# Patient Record
Sex: Male | Born: 1995 | Race: Black or African American | Hispanic: No | Marital: Single | Smoking: Current some day smoker
Health system: Southern US, Community
[De-identification: ages and names within clinical notes are randomized; demographics above are authoritative.]

## PROBLEM LIST (undated history)

## (undated) DIAGNOSIS — L309 Dermatitis, unspecified: Secondary | ICD-10-CM

---
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
## 2021-12-28 ENCOUNTER — Emergency Department (HOSPITAL_COMMUNITY): Payer: Self-pay

## 2021-12-28 ENCOUNTER — Emergency Department (HOSPITAL_COMMUNITY)
Admission: EM | Admit: 2021-12-28 | Discharge: 2021-12-28 | Disposition: A | Discharge status: Home / Self Care | Payer: Self-pay | Attending: Emergency Medicine | Admitting: Emergency Medicine

## 2021-12-28 ENCOUNTER — Encounter (HOSPITAL_COMMUNITY): Payer: Self-pay | Admitting: *Deleted

## 2021-12-28 ENCOUNTER — Other Ambulatory Visit: Payer: Self-pay

## 2021-12-28 DIAGNOSIS — M25461 Effusion, right knee: Secondary | ICD-10-CM | POA: Insufficient documentation

## 2021-12-28 HISTORY — DX: Dermatitis, unspecified: L30.9

## 2021-12-28 LAB — CBC WITH DIFFERENTIAL/PLATELET
Abs Immature Granulocytes: 0.02 10*3/uL (ref 0.00–0.07)
Basophils Absolute: 0 10*3/uL (ref 0.0–0.1)
Basophils Relative: 0 %
Eosinophils Absolute: 0.1 10*3/uL (ref 0.0–0.5)
Eosinophils Relative: 1 %
HCT: 47.2 % (ref 39.0–52.0)
Hemoglobin: 15.7 g/dL (ref 13.0–17.0)
Immature Granulocytes: 0 %
Lymphocytes Relative: 28 %
Lymphs Abs: 2.2 10*3/uL (ref 0.7–4.0)
MCH: 28.1 pg (ref 26.0–34.0)
MCHC: 33.3 g/dL (ref 30.0–36.0)
MCV: 84.6 fL (ref 80.0–100.0)
Monocytes Absolute: 0.6 10*3/uL (ref 0.1–1.0)
Monocytes Relative: 7 %
Neutro Abs: 4.8 10*3/uL (ref 1.7–7.7)
Neutrophils Relative %: 64 %
Platelets: 271 10*3/uL (ref 150–400)
RBC: 5.58 MIL/uL (ref 4.22–5.81)
RDW: 12.1 % (ref 11.5–15.5)
WBC: 7.7 10*3/uL (ref 4.0–10.5)
nRBC: 0 % (ref 0.0–0.2)

## 2021-12-28 LAB — COMPREHENSIVE METABOLIC PANEL
ALT: 10 U/L (ref 0–44)
AST: 13 U/L — ABNORMAL LOW (ref 15–41)
Albumin: 3.8 g/dL (ref 3.5–5.0)
Alkaline Phosphatase: 85 U/L (ref 38–126)
Anion gap: 10 (ref 5–15)
BUN: 10 mg/dL (ref 6–20)
CO2: 26 mmol/L (ref 22–32)
Calcium: 9.4 mg/dL (ref 8.9–10.3)
Chloride: 99 mmol/L (ref 98–111)
Creatinine, Ser: 0.92 mg/dL (ref 0.61–1.24)
GFR, Estimated: >60 mL/min (ref >60)
Glucose, Bld: 90 mg/dL (ref 70–99)
Potassium: 4.4 mmol/L (ref 3.5–5.1)
Sodium: 135 mmol/L (ref 135–145)
Total Bilirubin: 0.7 mg/dL (ref 0.3–1.2)
Total Protein: 7.4 g/dL (ref 6.5–8.1)

## 2021-12-28 LAB — SYNOVIAL CELL COUNT + DIFF, W/ CRYSTALS
Eosinophils-Synovial: 0 % (ref 0–1)
Lymphocytes-Synovial Fld: 37 % — ABNORMAL HIGH (ref 0–20)
Monocyte-Macrophage-Synovial Fluid: 15 % — ABNORMAL LOW (ref 50–90)
Neutrophil, Synovial: 48 % — ABNORMAL HIGH (ref 0–25)
WBC, Synovial: 8295 /mm3 — ABNORMAL HIGH (ref 0–200)

## 2021-12-28 LAB — GRAM STAIN

## 2021-12-28 LAB — C-REACTIVE PROTEIN: CRP: 3.9 mg/dL — ABNORMAL HIGH (ref <1.0)

## 2021-12-28 LAB — SEDIMENTATION RATE: Sed Rate: 13 mm/hr (ref 0–16)

## 2021-12-28 MED ORDER — LIDOCAINE-EPINEPHRINE 2 %-1:100000 IJ SOLN: 20.0000 mL | Freq: Once | INTRAMUSCULAR | Status: DC

## 2021-12-28 MED ORDER — NAPROXEN 375 MG PO TABS: 375.0000 mg | ORAL_TABLET | Freq: Two times a day (BID) | ORAL | 0 refills | Status: AC

## 2021-12-28 MED ORDER — ACETAMINOPHEN 325 MG PO TABS
650.0000 mg | ORAL_TABLET | Freq: Once | ORAL | Status: AC
  Administered 2021-12-28: 650 mg via ORAL
  Filled 2021-12-28: qty 2

## 2021-12-28 MED ORDER — LIDOCAINE-EPINEPHRINE (PF) 2 %-1:200000 IJ SOLN
20.0000 mL | Freq: Once | INTRAMUSCULAR | Status: AC
  Administered 2021-12-28: 20 mL via INTRADERMAL
  Filled 2021-12-28: qty 20

## 2021-12-28 NOTE — ED Provider Notes (Cosign Needed)
[facility] EMERGENCY DEPARTMENT Provider Note   CSN: [id number] Arrival date & time: 12/28/21  1419     History  Chief Complaint  Patient presents with   Knee Pain    Johnny Payne is a 26 y.o. male.  26 y.o male with no PMH presents to the ED with a chief complaint of right knee pain (non-traumatic) x 3 days. Patient reports watching TV when he noted, pain localized to the right knee, there was some swelling noted at first which later worsened the swelling.  He reports worsening pain as the days have gone by.  He has been taking ibuprofen, Advil without any improvement in symptoms.  He did have an accident 2 months ago, where he had a garage door fall on his abdomen, right leg, but reports he did not have any issues after this with his right leg.  On today's visit, he reports worsening swelling, worsening pain.  He is sexually active, with females last encounter approximately a month ago.  He is without any penile discharge, no fever, no other complaints.   The history is provided by the patient and medical records.  Knee Pain Location:  Knee Time since incident:  3 days Injury: no   Knee location:  R knee Pain details:    Quality:  Aching   Radiates to:  Does not radiate   Severity:  Moderate   Onset quality:  Sudden   Duration:  3 days   Timing:  Constant   Progression:  Worsening Chronicity:  New Dislocation: no   Relieved by:  Nothing Worsened by:  Flexion Ineffective treatments:  Acetaminophen Associated symptoms: fever   Risk factors: no known bone disorder, no obesity and no recent illness       Home Medications Prior to Admission medications   Medication Sig Start Date End Date Taking? Authorizing Provider  naproxen (NAPROSYN) 375 MG tablet Take 1 tablet (375 mg total) by mouth 2 (two) times daily for 7 days. 12/28/21 01/04/22 Yes Esterlene Atiyeh, Beverley Fiedler, PA-C      Allergies    Vancomycin    Review of Systems   Review of Systems  Constitutional:   Positive for fever.  Musculoskeletal:  Positive for arthralgias.   Physical Exam Updated Vital Signs BP 119/60    Pulse 78    Temp 100.1 F (37.8 C) (Oral)    Resp 16    Ht 5\' 11"  (1.803 m)    Wt 68 kg    SpO2 98%    BMI 20.92 kg/m  Physical Exam Vitals and nursing note reviewed.  Constitutional:      Appearance: Normal appearance.  HENT:     Head: Normocephalic and atraumatic.     Mouth/Throat:     Mouth: Mucous membranes are moist.     Comments: No exudates, tonsils equal and symmetric.  Eyes:     Pupils: Pupils are equal, round, and reactive to light.  Cardiovascular:     Rate and Rhythm: Normal rate.  Pulmonary:     Effort: Pulmonary effort is normal.  Abdominal:     General: Abdomen is flat.  Musculoskeletal:        General: Swelling present.     Cervical back: Normal range of motion and neck supple.     Right lower leg: Swelling and tenderness present. No bony tenderness.       Legs:  Skin:    General: Skin is warm and dry.  Neurological:     Mental Status:  He is alert and oriented to person, place, and time.    ED Results / Procedures / Treatments   Labs (all labs ordered are listed, but only abnormal results are displayed) Labs Reviewed  COMPREHENSIVE METABOLIC PANEL - Abnormal; Notable for the following components:      Result Value   AST 13 (*)    All other components within normal limits  C-REACTIVE PROTEIN - Abnormal; Notable for the following components:   CRP 3.9 (*)    All other components within normal limits  GRAM STAIN  CULTURE, BODY FLUID W GRAM STAIN -BOTTLE  CBC WITH DIFFERENTIAL/PLATELET  SEDIMENTATION RATE  GLUCOSE, BODY FLUID OTHER            PROTEIN, BODY FLUID (OTHER)  SYNOVIAL CELL COUNT + DIFF, W/ CRYSTALS    EKG None  Radiology DG Knee Complete 4 Views Right  Result Date: 12/28/2021 CLINICAL DATA:  Right knee swelling and pain. EXAM: RIGHT KNEE - COMPLETE 4+ VIEW COMPARISON:  None. FINDINGS: No acute fracture or dislocation.  Moderate to large suprapatellar joint effusion. Joint spaces maintained. IMPRESSION: Moderate suprapatellar joint effusion. Electronically Signed   By: Abigail Miyamoto M.D.   On: 12/28/2021 16:17    Procedures .Joint Aspiration/Arthrocentesis  Date/Time: 12/28/2021 6:23 PM Performed by: Janeece Fitting, PA-C Authorized by: Janeece Fitting, PA-C   Consent:    Consent obtained:  Verbal   Consent given by:  Patient   Risks discussed:  Infection, pain, bleeding and incomplete drainage   Alternatives discussed:  No treatment Universal protocol:    Procedure explained and questions answered to patient or proxy's satisfaction: yes     Relevant documents present and verified: yes     Test results available: yes     Site/side marked: yes     Patient identity confirmed:  Verbally with patient Location:    Location:  Knee   Knee:  R knee Anesthesia:    Anesthesia method:  Local infiltration   Local anesthetic:  Lidocaine 1% WITH epi Procedure details:    Needle gauge:  18 G   Approach:  Lateral   Aspirate amount:  75   Aspirate characteristics:  Clear and yellow   Steroid injected: no     Specimen collected: yes   Post-procedure details:    Dressing:  Adhesive bandage   Procedure completion:  Tolerated well, no immediate complications    Medications Ordered in ED Medications  acetaminophen (TYLENOL) tablet 650 mg (650 mg Oral Given 12/28/21 1702)  lidocaine-EPINEPHrine (XYLOCAINE W/EPI) 2 %-1:200000 (PF) injection 20 mL (20 mLs Intradermal Given 12/28/21 1819)    ED Course/ Medical Decision Making/ A&P Clinical Course as of 12/28/21 2025  Sat Dec 28, 2021  1959 Gram Stain: FEW WBC PRESENT,BOTH PMN AND MONONUCLEAR NO ORGANISMS SEEN Performed at [facility], [address] [place], [place] [address]  [JS]    Clinical Course User Index [JS] Janeece Fitting, PA-C                            This patient presents to the ED for concern of right knee pain, this involves a number of  treatment options, and is a complaint that carries with it a high risk of complications and morbidity.  The differential diagnosis includes septic knee, gonococcal arthritis, Lyme disease, trauma.    Co morbidities: Discussed in HPI   Brief History:  Patient here with swollen right atraumatic knee for 3 days.  Febrile on arrival with a temperature of 100.1, has taken over-the-counter medications for improvement in symptoms.  No other signs or symptoms.  EMR reviewed including pt PMHx, past surgical history and past visits to ER.   See HPI for more details   Lab Tests:  I ordered and independently interpreted labs.  The pertinent results include:    I personally reviewed all laboratory work and imaging. Metabolic panel without any acute abnormality specifically kidney function within normal limits and no significant electrolyte abnormalities. CBC without leukocytosis or significant anemia.  CRP level is abnormal today at 3.9, some suspicion for infection at this time.   Imaging Studies:  Moderate to large suprapatellar joint effusion.  Medicines ordered:  I ordered medication including Tylenol for pyrexia Reevaluation of the patient after these medicines showed that the patient improved I have reviewed the patients home medicines and have made adjustments as needed   Critical Interventions:  Knee aspiration performed by me supervised by my attending Dr. Jeanell Sparrow. Approximately 75 ml of clear yellow fluid drained from right knee, specimen walked to lab. Patient tolerated this well.   Reevaluation:  After the interventions noted above I re-evaluated patient and found that they have :improved   Social Determinants of Health:  The patient's social determinants of health were a factor in the care of this patient    Problem List / ED Course:  Patient here with atraumatic knee. Swelling noted for the past 3 days, no improvement with anti-inflammatories Labs without any  leukocytosis, however was febrile on arrival.  Gram stain obtain which did not show any acute infection at this time to suggest septic knee.  He is ambulating on knee with steady gate. We discussed RICE therapy along with follow up with orthopedist.    Dispostion:  After consideration of the diagnostic results and the patients response to treatment, I feel that the patent would benefit from outpatient follow-up with orthopedics, RICE therapy, anti-inflammatories.    Portions of this note were generated with [employer]. Dictation errors may occur despite best attempts at proofreading.  Final Clinical Impression(s) / ED Diagnoses Final diagnoses:  Effusion of right knee    Rx / DC Orders ED Discharge Orders          Ordered    naproxen (NAPROSYN) 375 MG tablet  2 times daily        12/28/21 2000              Janeece Fitting, PA-C 12/28/21 2025

## 2021-12-28 NOTE — ED Triage Notes (Signed)
PT states swelling to R knee. States some pain.  Denies injury. ?

## 2021-12-28 NOTE — Discharge Instructions (Addendum)
Your labs today did not show any acute infection. ? ?We sent off the cultures for your right knee.  You will need to schedule an appointment with orthopedics in order to further follow-up for this swelling of your right knee. ? ?I have prescribed a short course of anti-inflammatories in order to help with your pain.  Please take 1 tablet twice a day for the next 7 days. ?

## 2021-12-28 NOTE — ED Notes (Signed)
Pt d/c home per MD order., Discharge summary reviewed with pt, pt verbalizes understanding. Off unit via WC. No s./s of acute distress noted. Discharged home with visitor.  ?

## 2021-12-30 LAB — GLUCOSE, BODY FLUID OTHER: Glucose, Body Fluid Other: 42 mg/dL

## 2021-12-30 LAB — PATHOLOGIST SMEAR REVIEW

## 2021-12-30 LAB — PROTEIN, BODY FLUID (OTHER): Total Protein, Body Fluid Other: 5.1 g/dL

## 2022-01-01 ENCOUNTER — Ambulatory Visit (INDEPENDENT_AMBULATORY_CARE_PROVIDER_SITE_OTHER): Payer: Self-pay | Admitting: Orthopaedic Surgery

## 2022-01-01 ENCOUNTER — Other Ambulatory Visit: Payer: Self-pay

## 2022-01-01 DIAGNOSIS — M1A0610 Idiopathic chronic gout, right knee, without tophus (tophi): Secondary | ICD-10-CM

## 2022-01-01 MED ORDER — TRIAMCINOLONE ACETONIDE 40 MG/ML IJ SUSP
80.0000 mg | INTRAMUSCULAR | Status: AC | PRN
  Administered 2022-01-01: 80 mg via INTRA_ARTICULAR

## 2022-01-01 MED ORDER — LIDOCAINE HCL 1 % IJ SOLN
4.0000 mL | INTRAMUSCULAR | Status: AC | PRN
  Administered 2022-01-01: 4 mL

## 2022-01-01 NOTE — Progress Notes (Signed)
? ?                            ? ? ?Chief Complaint: Right knee pain ?  ? ? ?History of Present Illness:  ? ? ?Johnny Payne is a 26 y.o. male presents with 1 week of atraumatic right knee pain.  He initially presented to the emergency room at which time he had his knee aspirated.  There was monosodium urate crystals.  He was sent to orthopedics for further management.  Denies any history of known gout.  He is very active.  Enjoys playing video games and is very time.  Denies any fevers or chills. ? ? ? ?Surgical History:   ?None ? ?PMH/PSH/Family History/Social History/Meds/Allergies:   ? ?Past Medical History:  ?Diagnosis Date  ?? Eczema   ? ?No past surgical history on file. ?Social History  ? ?Socioeconomic History  ?? Marital status: Single  ?  Spouse name: Not on file  ?? Number of children: Not on file  ?? Years of education: Not on file  ?? Highest education level: Not on file  ?Occupational History  ?? Not on file  ?Tobacco Use  ?? Smoking status: Never  ?? Smokeless tobacco: Not on file  ?Substance and Sexual Activity  ?? Alcohol use: No  ?? Drug use: No  ?? Sexual activity: Never  ?Other Topics Concern  ?? Not on file  ?Social History Narrative  ?? Not on file  ? ?Social Determinants of Health  ? ?Financial Resource Strain: Not on file  ?Food Insecurity: Not on file  ?Transportation Needs: Not on file  ?Physical Activity: Not on file  ?Stress: Not on file  ?Social Connections: Not on file  ? ?Family History  ?Problem Relation Age of Onset  ?? Asthma Maternal Uncle   ?? Cancer Maternal Grandfather   ? ?Allergies  ?Allergen Reactions  ?? Vancomycin Swelling  ?  Face, mouth and throat  ? ?Current Outpatient Medications  ?Medication Sig Dispense Refill  ?? naproxen (NAPROSYN) 375 MG tablet Take 1 tablet (375 mg total) by mouth 2 (two) times daily for 7 days. 14 tablet 0  ? ?No current facility-administered medications for this visit.  ? ?No results found. ? ?Review of Systems:   ?A ROS was performed  including pertinent positives and negatives as documented in the HPI. ? ?Physical Exam :   ?Constitutional: NAD and appears stated age ?Neurological: Alert and oriented ?Psych: Appropriate affect and cooperative ?There were no vitals taken for this visit.  ? ?Comprehensive Musculoskeletal Exam:   ? ?He has tenderness about all aspects of the knee.  There is an effusion.  He has range of motion from 0 to 120 degrees with minimal pain.  He has no erythema.  There is no redness.  Sensation is intact throughout.  2+ dorsalis pedis pulse ? ?Imaging:   ?Xray (4 views right knee): ?Normal ? ? ?I personally reviewed and interpreted the radiographs. ? ? ?Assessment:   ?26 year old male with evidence of right knee gout.  Today's visit I have recommended an ultrasound-guided injection of the right knee in order to give him better pain relief.  He would like to proceed with this.  I will see him back on an as-needed basis. ? ?Plan :   ? ?-Rheumatology referral ordered for long-term gout management ? ? ? ? ?Procedure Note ? ?Patient: Johnny Payne             ?  Date of Birth: April 19, 1996           ?MRN: [id number]             ?Visit Date: 01/01/2022 ? ?Procedures: ?Visit Diagnoses:  ?1. Idiopathic chronic gout of right knee without tophus   ? ? ?Large Joint Inj on 01/01/2022 12:07 PM ?Indications: pain ?Details: 22 G 1.5 in needle, ultrasound-guided anterior approach ? ?Arthrogram: No ? ?Medications: 4 mL lidocaine 1 %; 80 mg triamcinolone acetonide 40 MG/ML ?Outcome: tolerated well, no immediate complications ?Procedure, treatment alternatives, risks and benefits explained, specific risks discussed. Consent was given by the patient. Immediately prior to procedure a time out was called to verify the correct patient, procedure, equipment, support staff and site/side marked as required. Patient was prepped and draped in the usual sterile fashion.  ? ? ? ? ? ?I personally saw and evaluated the patient, and participated in the management  and treatment plan. ? ?Huel Cote, MD ?Attending Physician, Orthopedic Surgery ? ?This document was dictated using Conservation officer, historic buildings. A reasonable attempt at proof reading has been made to minimize errors. ?

## 2022-01-02 LAB — CULTURE, BODY FLUID W GRAM STAIN -BOTTLE: Culture: NO GROWTH

## 2022-09-20 IMAGING — DX DG KNEE COMPLETE 4+V*R*
4 series · 4 of 4 positions shown · non-contrast
Comparison: None.

CLINICAL DATA: Right knee swelling and pain.

EXAM:
RIGHT KNEE - COMPLETE 4+ VIEW

[knee ap]
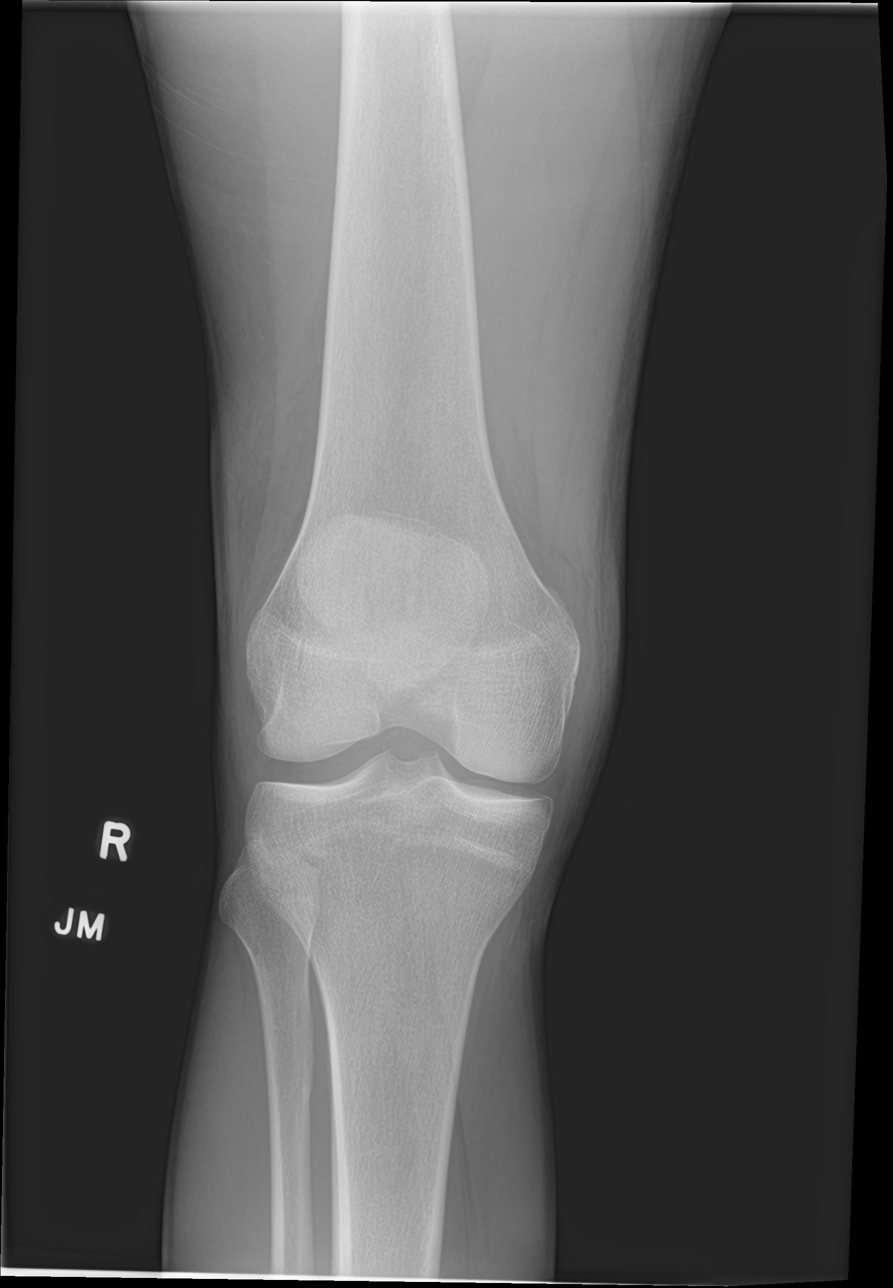

[knee lat]
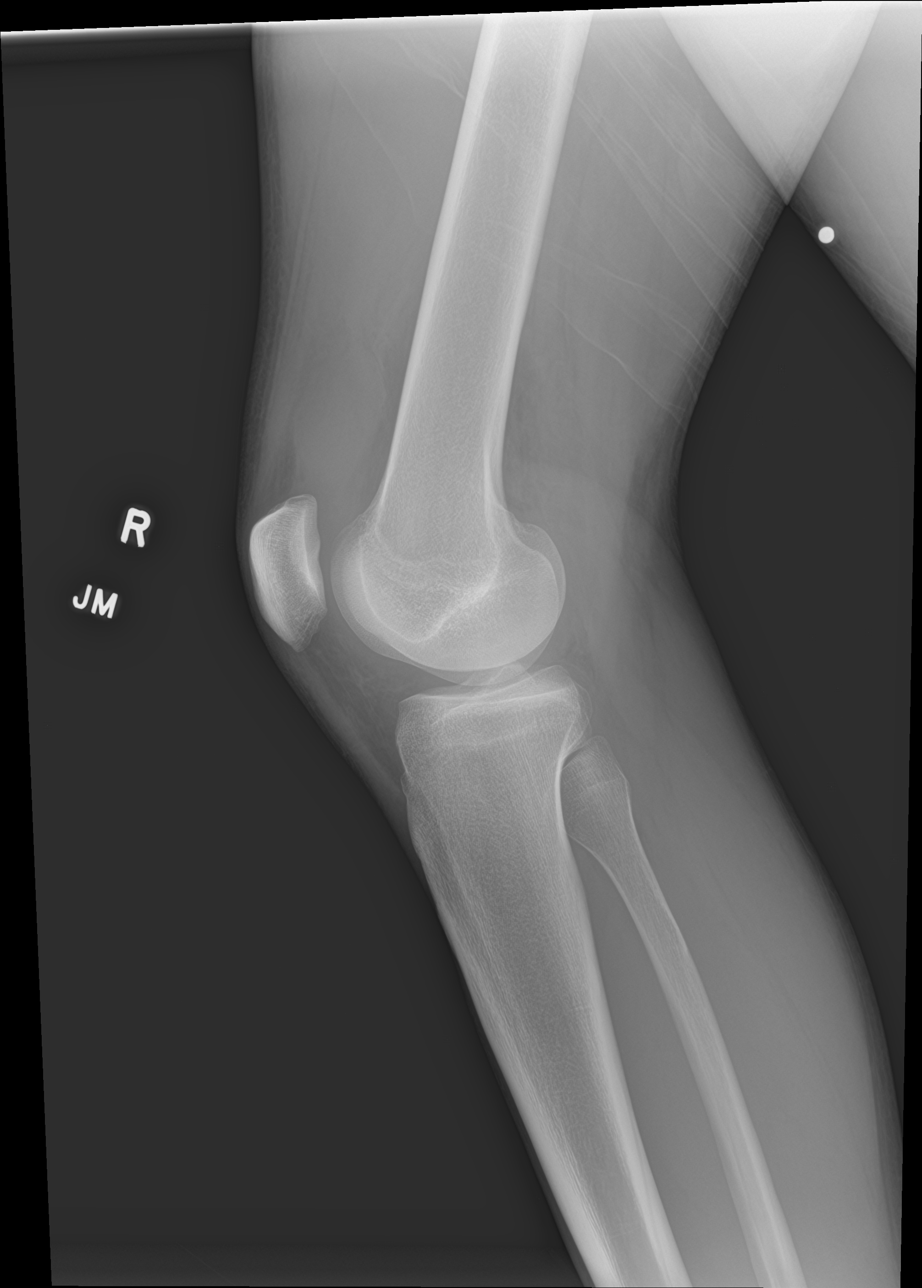

[knee obl (1 of 2)]
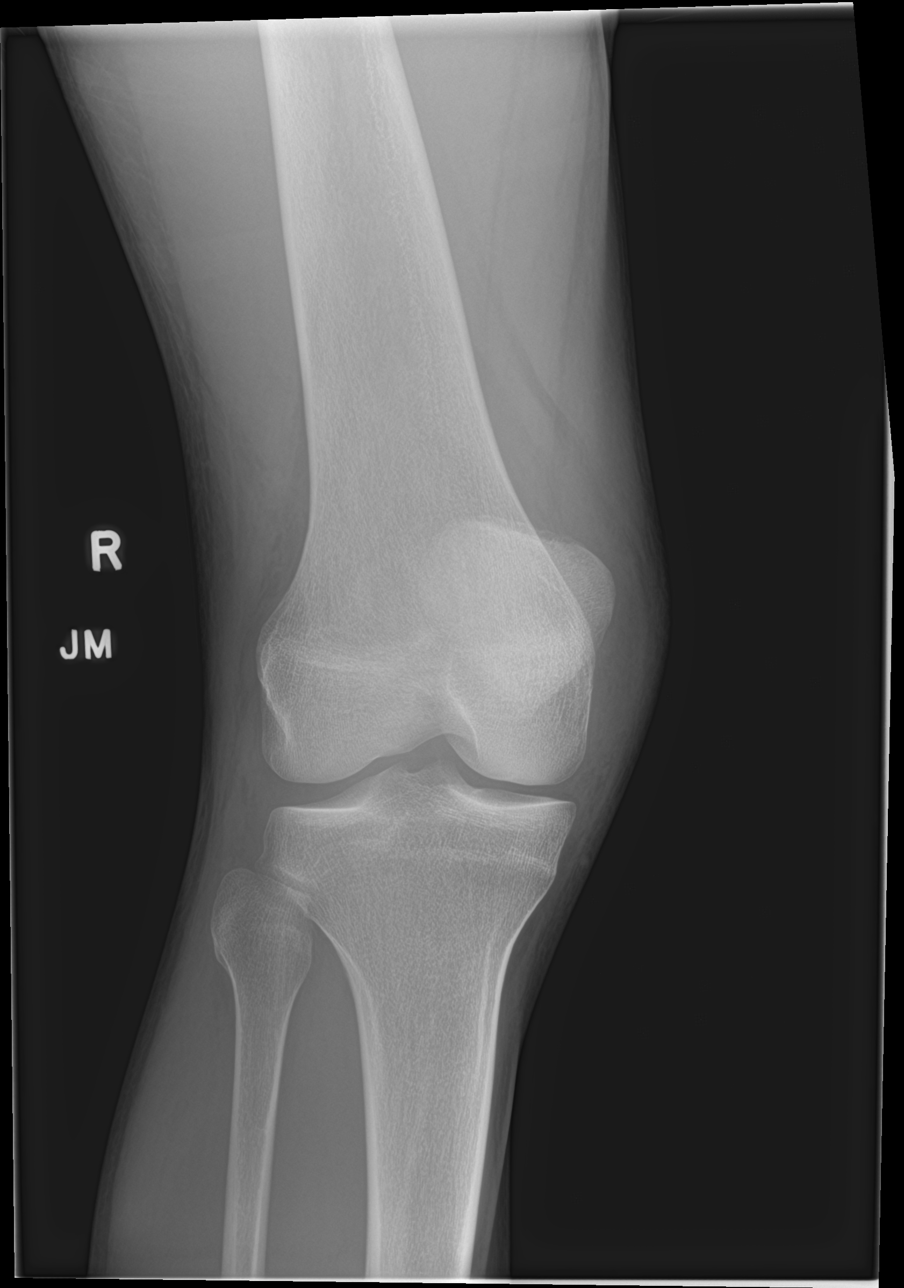

[knee obl (2 of 2)]
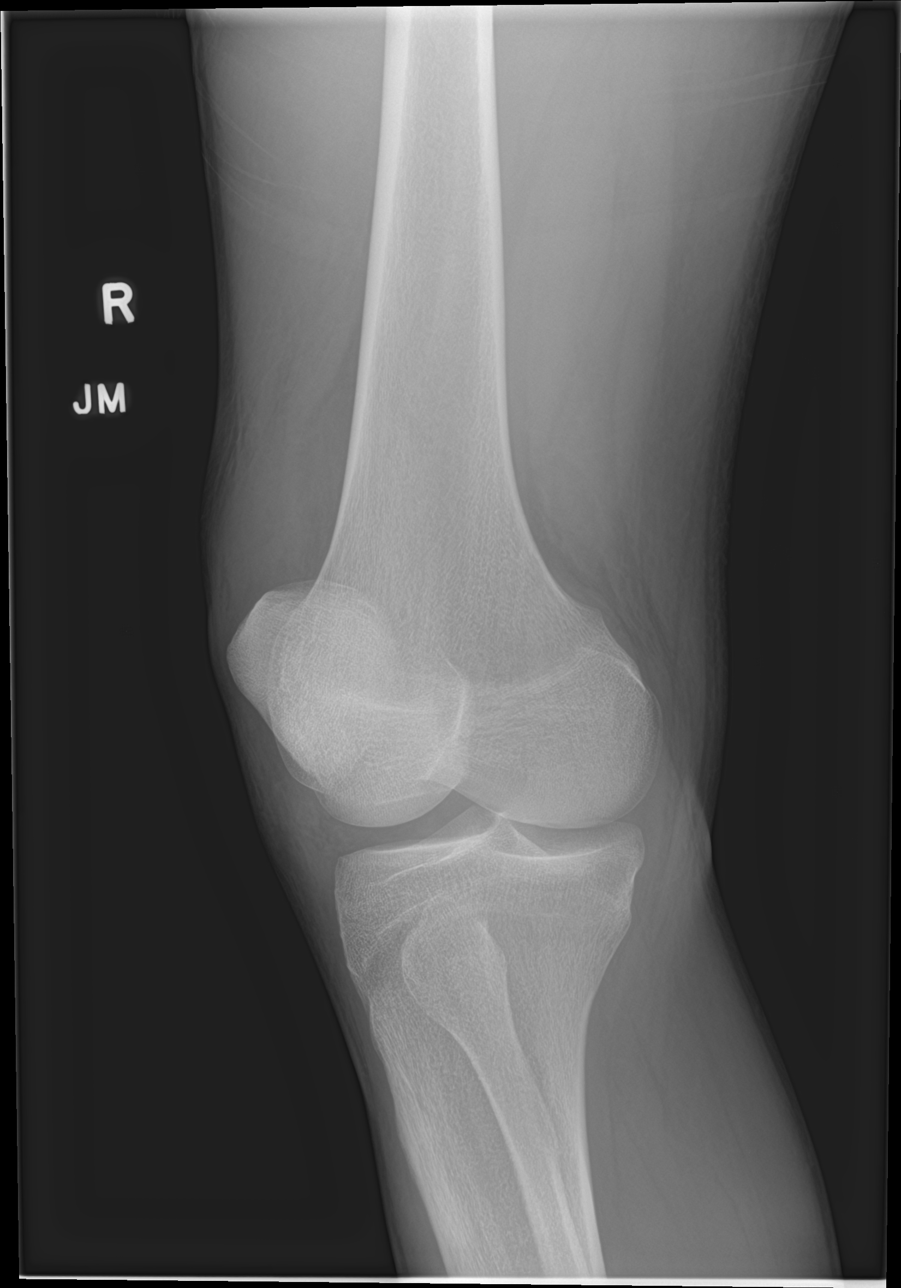

[4 of 4 positions shown; findings below may reference images not displayed]

FINDINGS: No acute fracture or dislocation. Moderate to large suprapatellar
joint effusion. Joint spaces maintained.
IMPRESSION: Moderate suprapatellar joint effusion.

## 2022-11-16 ENCOUNTER — Emergency Department (HOSPITAL_COMMUNITY)
Admission: EM | Admit: 2022-11-16 | Discharge: 2022-11-17 | Disposition: A | Discharge status: Home / Self Care | Payer: Self-pay | Attending: Emergency Medicine | Admitting: Emergency Medicine

## 2022-11-16 ENCOUNTER — Other Ambulatory Visit: Payer: Self-pay

## 2022-11-16 DIAGNOSIS — Z20822 Contact with and (suspected) exposure to covid-19: Secondary | ICD-10-CM | POA: Insufficient documentation

## 2022-11-16 DIAGNOSIS — J039 Acute tonsillitis, unspecified: Secondary | ICD-10-CM | POA: Insufficient documentation

## 2022-11-16 DIAGNOSIS — J36 Peritonsillar abscess: Secondary | ICD-10-CM

## 2022-11-16 LAB — RESP PANEL BY RT-PCR (RSV, FLU A&B, COVID)  RVPGX2
Influenza A by PCR: NEGATIVE
Influenza B by PCR: NEGATIVE
Resp Syncytial Virus by PCR: NEGATIVE
SARS Coronavirus 2 by RT PCR: NEGATIVE

## 2022-11-16 LAB — GROUP A STREP BY PCR: Group A Strep by PCR: NOT DETECTED

## 2022-11-16 NOTE — ED Triage Notes (Signed)
Patient reports sore throat with headache  emesis and lightheaded onset yesterday .

## 2022-11-16 NOTE — ED Provider Triage Note (Signed)
Emergency Medicine Provider Triage Evaluation Note  Johnny Payne , a 27 y.o. male  was evaluated in triage.  Pt complains of sore throat and cough.   Review of Systems  Positive: Sore throat  fever Negative: cough  Physical Exam  BP (!) 151/86   Pulse (!) 104   Temp 99.6 F (37.6 C)   Resp 18   SpO2 95%  Gen:   Awake, no distress   Throat erythematous  exudate  Resp:  Normal effort  MSK:   Moves extremities without difficulty  Other:    Medical Decision Making  Medically screening exam initiated at 8:20 PM.  Appropriate orders placed.  Johnny Payne was informed that the remainder of the evaluation will be completed by another provider, this initial triage assessment does not replace that evaluation, and the importance of remaining in the ED until their evaluation is complete.     Fransico Meadow, [place] 11/16/22 2021

## 2022-11-17 ENCOUNTER — Other Ambulatory Visit (HOSPITAL_COMMUNITY): Payer: Self-pay

## 2022-11-17 ENCOUNTER — Emergency Department (HOSPITAL_COMMUNITY): Payer: Self-pay

## 2022-11-17 LAB — CBC WITH DIFFERENTIAL/PLATELET
Abs Immature Granulocytes: 0.05 10*3/uL (ref 0.00–0.07)
Basophils Absolute: 0 10*3/uL (ref 0.0–0.1)
Basophils Relative: 0 %
Eosinophils Absolute: 0 10*3/uL (ref 0.0–0.5)
Eosinophils Relative: 0 %
HCT: 45.4 % (ref 39.0–52.0)
Hemoglobin: 15.3 g/dL (ref 13.0–17.0)
Immature Granulocytes: 0 %
Lymphocytes Relative: 19 %
Lymphs Abs: 2.3 10*3/uL (ref 0.7–4.0)
MCH: 29.5 pg (ref 26.0–34.0)
MCHC: 33.7 g/dL (ref 30.0–36.0)
MCV: 87.5 fL (ref 80.0–100.0)
Monocytes Absolute: 0.7 10*3/uL (ref 0.1–1.0)
Monocytes Relative: 6 %
Neutro Abs: 8.7 10*3/uL — ABNORMAL HIGH (ref 1.7–7.7)
Neutrophils Relative %: 75 %
Platelets: 217 10*3/uL (ref 150–400)
RBC: 5.19 MIL/uL (ref 4.22–5.81)
RDW: 12.1 % (ref 11.5–15.5)
WBC: 11.8 10*3/uL — ABNORMAL HIGH (ref 4.0–10.5)
nRBC: 0 % (ref 0.0–0.2)

## 2022-11-17 LAB — BASIC METABOLIC PANEL
Anion gap: 14 (ref 5–15)
BUN: 9 mg/dL (ref 6–20)
CO2: 20 mmol/L — ABNORMAL LOW (ref 22–32)
Calcium: 9.1 mg/dL (ref 8.9–10.3)
Chloride: 107 mmol/L (ref 98–111)
Creatinine, Ser: 0.97 mg/dL (ref 0.61–1.24)
GFR, Estimated: >60 mL/min (ref >60)
Glucose, Bld: 70 mg/dL (ref 70–99)
Potassium: 3.9 mmol/L (ref 3.5–5.1)
Sodium: 141 mmol/L (ref 135–145)

## 2022-11-17 LAB — MONONUCLEOSIS SCREEN: Mono Screen: NEGATIVE

## 2022-11-17 MED ORDER — METHYLPREDNISOLONE 4 MG PO TBPK
ORAL_TABLET | ORAL | 0 refills | Status: AC
  Filled 2022-11-17: qty 21, 6d supply, fill #0

## 2022-11-17 MED ORDER — SODIUM CHLORIDE 0.9 % IV SOLN
3.0000 g | Freq: Once | INTRAVENOUS | Status: AC
  Administered 2022-11-17: 3 g via INTRAVENOUS
  Filled 2022-11-17: qty 8

## 2022-11-17 MED ORDER — CLINDAMYCIN HCL 300 MG PO CAPS
300.0000 mg | ORAL_CAPSULE | Freq: Three times a day (TID) | ORAL | 0 refills | Status: AC
  Filled 2022-11-17: qty 21, 7d supply, fill #0

## 2022-11-17 MED ORDER — IOHEXOL 350 MG/ML SOLN
75.0000 mL | Freq: Once | INTRAVENOUS | Status: AC | PRN
  Administered 2022-11-17: 75 mL via INTRAVENOUS

## 2022-11-17 MED ORDER — KETOROLAC TROMETHAMINE 15 MG/ML IJ SOLN
15.0000 mg | Freq: Once | INTRAMUSCULAR | Status: AC
  Administered 2022-11-17: 15 mg via INTRAVENOUS
  Filled 2022-11-17: qty 1

## 2022-11-17 MED ORDER — IOHEXOL 350 MG/ML SOLN: 75.0000 mL | Freq: Once | INTRAVENOUS | Status: DC | PRN

## 2022-11-17 MED ORDER — DEXAMETHASONE SODIUM PHOSPHATE 10 MG/ML IJ SOLN
10.0000 mg | Freq: Once | INTRAMUSCULAR | Status: AC
  Administered 2022-11-17: 10 mg via INTRAVENOUS
  Filled 2022-11-17: qty 1

## 2022-11-17 MED ORDER — ONDANSETRON HCL 4 MG/2ML IJ SOLN
4.0000 mg | Freq: Once | INTRAMUSCULAR | Status: AC
  Administered 2022-11-17: 4 mg via INTRAVENOUS
  Filled 2022-11-17: qty 2

## 2022-11-17 NOTE — ED Notes (Signed)
Patient transported to CT 

## 2022-11-17 NOTE — Discharge Instructions (Addendum)
[address] [place] [address] [phone]  You have abscesses or pockets of infection in both of your tonsils that need to be drained.  Go to the office listed above for 3 PM to see Dr. Fredric Dine the ear nose and throat doctor.  Continue the antibiotics as prescribed as well as the steroid Dosepak.  You can also take Tylenol and ibuprofen as needed for pain.  Come back if any difficulties being seen , getting her abscesses drained, any severe worsening pain, difficulty breathing or any other symptoms concerning to you.

## 2022-11-17 NOTE — ED Provider Notes (Signed)
[facility] Provider Note   CSN: [id number] Arrival date & time: 11/16/22  1949     History  Chief Complaint  Patient presents with   Sore Throat    Johnny Payne is a 27 y.o. male.  With PMH of eczema who presents with 2 days of worsening throat swelling and pain associated with odynophagia and muffled voice. Patient reports pain worse on his left side.  Patient has no history of tonsillitis or PTA.  He developed fever with symptoms and difficulty swallowing but no drooling, no difficulty breathing, no coughing, no congestion or rhinorrhea.  He has not been on any recent antibiotics.  He has not taken any medication since yesterday.   Sore Throat       Home Medications Prior to Admission medications   Medication Sig Start Date End Date Taking? Authorizing Provider  clindamycin (CLEOCIN) 300 MG capsule Take 1 capsule (300 mg total) by mouth 3 (three) times daily for 7 days. 11/17/22 11/24/22 Yes Elgie Congo, MD  methylPREDNISolone (MEDROL DOSEPAK) 4 MG TBPK tablet Take 6 tablets (24 mg total) by mouth daily for 1 day, THEN 5 tablets (20 mg total) daily for 1 day, THEN 4 tablets (16 mg total) daily for 1 day, THEN 3 tablets (12 mg total) daily for 1 day, THEN 2 tablets (8 mg total) daily for 1 day, THEN 1 tablet (4 mg total) daily for 1 day. 11/18/22 11/24/22 Yes Elgie Congo, MD      Allergies    Vancomycin    Review of Systems   Review of Systems  Physical Exam Updated Vital Signs BP (!) 117/59   Pulse 81   Temp 98.4 F (36.9 C)   Resp 20   SpO2 100%  Physical Exam Constitutional: Alert and oriented.  No acute distress nontoxic Eyes: Conjunctivae are normal. ENT      Head: Normocephalic and atraumatic.      Nose: No congestion.      Mouth/Throat: Mucous membranes are moist.  Uvula slight deviation to the left, bilateral tonsillar hypertrophy with erythema and mild exudate.  No active drooling, speaking in  muffled voice      Neck: No stridor.  No tripoding Cardiovascular: S1, S2, regular rate Respiratory: Normal respiratory effort. Breath sounds are normal.  O2 sat 97 on RA Gastrointestinal: Nondistended Musculoskeletal: Normal range of motion in all extremities. Neurologic: Normal speech and language. No gross focal neurologic deficits are appreciated. Skin: Skin is warm, dry and intact. No rash noted. Psychiatric: Mood and affect are normal. Speech and behavior are normal.  ED Results / Procedures / Treatments   Labs (all labs ordered are listed, but only abnormal results are displayed) Labs Reviewed  BASIC METABOLIC PANEL - Abnormal; Notable for the following components:      Result Value   CO2 20 (*)    All other components within normal limits  CBC WITH DIFFERENTIAL/PLATELET - Abnormal; Notable for the following components:   WBC 11.8 (*)    Neutro Abs 8.7 (*)    All other components within normal limits  GROUP A STREP BY PCR  RESP PANEL BY RT-PCR (RSV, FLU A&B, COVID)  [id number]  MONONUCLEOSIS SCREEN    EKG None  Radiology CT Soft Tissue Neck W Contrast  Result Date: 11/17/2022 CLINICAL DATA:  Evaluate for peritonsillar abscess or deep space infection. EXAM: CT NECK WITH CONTRAST TECHNIQUE: Multidetector CT imaging of the neck was performed using the standard protocol  following the bolus administration of intravenous contrast. RADIATION DOSE REDUCTION: This exam was performed according to the departmental dose-optimization program which includes automated exposure control, adjustment of the mA and/or kV according to patient size and/or use of iterative reconstruction technique. CONTRAST:  4mL OMNIPAQUE IOHEXOL 350 MG/ML SOLN COMPARISON:  None Available. FINDINGS: Pharynx and larynx: Bilateral tonsillitis with peritonsillar abscesses. Smaller para tonsil scratch that smaller peritonsillar abscess on the right measuring 7-8 mm in size. Larger peritonsillar abscess on the left  measuring 2.5 x 1.3 cm. No evidence of deep space parapharyngeal extension. Salivary glands: Parotid and submandibular glands are normal. Thyroid: Normal Lymph nodes: Mild reactive bilateral nodal prominence. No suppuration. Vascular: Normal Limited intracranial: Normal Visualized orbits: Normal Mastoids and visualized paranasal sinuses: Clear Skeleton: Normal Upper chest: Normal Other: None IMPRESSION: Bilateral tonsillitis with peritonsillar abscesses, larger on the left than the right. No evidence of deep space parapharyngeal extension. Mild reactive nodal prominence. No suppuration. Electronically Signed   By: Nelson Chimes M.D.   On: 11/17/2022 13:49    Procedures Procedures    Medications Ordered in ED Medications  iohexol (OMNIPAQUE) 350 MG/ML injection 75 mL (has no administration in time range)  dexamethasone (DECADRON) injection 10 mg (10 mg Intravenous Given 11/17/22 1015)  Ampicillin-Sulbactam (UNASYN) 3 g in sodium chloride 0.9 % 100 mL IVPB (0 g Intravenous Stopped 11/17/22 1121)  ketorolac (TORADOL) 15 MG/ML injection 15 mg (15 mg Intravenous Given 11/17/22 1014)  ondansetron (ZOFRAN) injection 4 mg (4 mg Intravenous Given 11/17/22 1015)  iohexol (OMNIPAQUE) 350 MG/ML injection 75 mL (75 mLs Intravenous Contrast Given 11/17/22 1235)    ED Course/ Medical Decision Making/ A&P Clinical Course as of 11/17/22 1801  Mon Nov 17, 2022  1229 Labs reviewed by me.  Mild leukocytosis 11.8 with left shift.  Creatinine 0.97 within normal limits.  No acute electro abnormalities.  Monoscreen negative. [VB]  7829 Spoke with Dr. Isaias Cowman of ENT who can see patient today in her clinic around 330 to drain bilateral abscess.  I discussed the plan with patient who is in agreement.  He is protecting his airway.  I have sent prescriptions for clindamycin and Solu-Medrol per ENT request. [VB]    Clinical Course User Index [VB] Elgie Congo, MD                            Medical Decision  Making Johnny Payne is a 27 y.o. male.  With PMH of eczema who presents with 2 days of worsening throat swelling and pain associated with odynophagia and muffled voice.   Patient has bilateral tonsillar hypertrophy slightly worse on left with no muffled voice.  However, nontoxic in appearance maintaining secretions no stridor no tripoding no evidence of respiratory distress.  Considered for patient's presentation possible tonsillitis either bacterial or viral in nature versus possible PTA or deep space infection.  Strep swab obtained negative.  Viral swabs obtained negative for flu, COVID and RSV.  Labs reviewed by me.  Mild leukocytosis 11.8 with left shift.  Creatinine 0.97 within normal limits.  No acute electro abnormalities.  Monoscreen negative. [VB]  Ct reviewed by me, I visualized bilateral abscesses worse on left, c/w radiology read.  Spoke with Dr. Fredric Dine of ENT who can see patient today in her clinic around 330 to drain bilateral abscess.  I discussed the plan with patient who is in agreement.  He is protecting his airway.  I have sent prescriptions for  clindamycin and Solu-Medrol per ENT request. [VB]      Amount and/or Complexity of Data Reviewed Labs: ordered. Radiology: ordered.  Risk Prescription drug management.    Final Clinical Impression(s) / ED Diagnoses Final diagnoses:  Tonsillitis  Peritonsillar abscess    Rx / DC Orders ED Discharge Orders          Ordered    methylPREDNISolone (MEDROL DOSEPAK) 4 MG TBPK tablet  Daily        11/17/22 1420    clindamycin (CLEOCIN) 300 MG capsule  3 times daily        11/17/22 1420              Mardene Sayer, MD 11/17/22 1801

## 2023-07-18 ENCOUNTER — Emergency Department
Admission: EM | Admit: 2023-07-18 | Discharge: 2023-07-18 | Disposition: A | Discharge status: Home / Self Care | Payer: Medicaid Other | Attending: Emergency Medicine | Admitting: Emergency Medicine

## 2023-07-18 ENCOUNTER — Other Ambulatory Visit: Payer: Self-pay

## 2023-07-18 ENCOUNTER — Emergency Department: Payer: Medicaid Other

## 2023-07-18 DIAGNOSIS — Z20822 Contact with and (suspected) exposure to covid-19: Secondary | ICD-10-CM | POA: Insufficient documentation

## 2023-07-18 DIAGNOSIS — A084 Viral intestinal infection, unspecified: Secondary | ICD-10-CM | POA: Diagnosis not present

## 2023-07-18 DIAGNOSIS — R1010 Upper abdominal pain, unspecified: Secondary | ICD-10-CM | POA: Diagnosis present

## 2023-07-18 LAB — CBC WITH DIFFERENTIAL/PLATELET
Abs Immature Granulocytes: 0.03 10*3/uL (ref 0.00–0.07)
Basophils Absolute: 0 10*3/uL (ref 0.0–0.1)
Basophils Relative: 0 %
Eosinophils Absolute: 0 10*3/uL (ref 0.0–0.5)
Eosinophils Relative: 0 %
HCT: 48.9 % (ref 39.0–52.0)
Hemoglobin: 16.5 g/dL (ref 13.0–17.0)
Immature Granulocytes: 0 %
Lymphocytes Relative: 20 %
Lymphs Abs: 1.4 10*3/uL (ref 0.7–4.0)
MCH: 28 pg (ref 26.0–34.0)
MCHC: 33.7 g/dL (ref 30.0–36.0)
MCV: 83 fL (ref 80.0–100.0)
Monocytes Absolute: 0.7 10*3/uL (ref 0.1–1.0)
Monocytes Relative: 9 %
Neutro Abs: 5.1 10*3/uL (ref 1.7–7.7)
Neutrophils Relative %: 71 %
Platelets: 187 10*3/uL (ref 150–400)
RBC: 5.89 MIL/uL — ABNORMAL HIGH (ref 4.22–5.81)
RDW: 12.2 % (ref 11.5–15.5)
Smear Review: NORMAL
WBC: 7.2 10*3/uL (ref 4.0–10.5)
nRBC: 0 % (ref 0.0–0.2)

## 2023-07-18 LAB — HEPATIC FUNCTION PANEL
ALT: 19 U/L (ref 0–44)
AST: 21 U/L (ref 15–41)
Albumin: 3.9 g/dL (ref 3.5–5.0)
Alkaline Phosphatase: 58 U/L (ref 38–126)
Bilirubin, Direct: 0.1 mg/dL (ref 0.0–0.2)
Indirect Bilirubin: 0.8 mg/dL (ref 0.3–0.9)
Total Bilirubin: 0.9 mg/dL (ref 0.3–1.2)
Total Protein: 7.5 g/dL (ref 6.5–8.1)

## 2023-07-18 LAB — URINALYSIS, ROUTINE W REFLEX MICROSCOPIC
Bacteria, UA: NONE SEEN
Bilirubin Urine: NEGATIVE
Glucose, UA: NEGATIVE mg/dL
Hgb urine dipstick: NEGATIVE
Ketones, ur: 20 mg/dL — AB
Leukocytes,Ua: NEGATIVE
Nitrite: NEGATIVE
Protein, ur: 100 mg/dL — AB
Specific Gravity, Urine: 1.035 — ABNORMAL HIGH (ref 1.005–1.030)
Squamous Epithelial / HPF: 0 /[HPF] (ref 0–5)
pH: 5 (ref 5.0–8.0)

## 2023-07-18 LAB — BASIC METABOLIC PANEL
Anion gap: 10 (ref 5–15)
BUN: 11 mg/dL (ref 6–20)
CO2: 24 mmol/L (ref 22–32)
Calcium: 8.7 mg/dL — ABNORMAL LOW (ref 8.9–10.3)
Chloride: 100 mmol/L (ref 98–111)
Creatinine, Ser: 0.95 mg/dL (ref 0.61–1.24)
GFR, Estimated: >60 mL/min (ref >60)
Glucose, Bld: 110 mg/dL — ABNORMAL HIGH (ref 70–99)
Potassium: 3.5 mmol/L (ref 3.5–5.1)
Sodium: 134 mmol/L — ABNORMAL LOW (ref 135–145)

## 2023-07-18 LAB — SARS CORONAVIRUS 2 BY RT PCR: SARS Coronavirus 2 by RT PCR: NEGATIVE

## 2023-07-18 LAB — LIPASE, BLOOD: Lipase: 26 U/L (ref 11–51)

## 2023-07-18 MED ORDER — KETOROLAC TROMETHAMINE 15 MG/ML IJ SOLN
15.0000 mg | Freq: Once | INTRAMUSCULAR | Status: AC
  Administered 2023-07-18: 15 mg via INTRAVENOUS
  Filled 2023-07-18: qty 1

## 2023-07-18 MED ORDER — ACETAMINOPHEN 325 MG PO TABS
650.0000 mg | ORAL_TABLET | Freq: Once | ORAL | Status: AC | PRN
  Administered 2023-07-18: 650 mg via ORAL
  Filled 2023-07-18: qty 2

## 2023-07-18 MED ORDER — ONDANSETRON 4 MG PO TBDP: 4.0000 mg | ORAL_TABLET | Freq: Three times a day (TID) | ORAL | 0 refills | Status: DC | PRN

## 2023-07-18 MED ORDER — PANTOPRAZOLE SODIUM 40 MG IV SOLR
40.0000 mg | Freq: Once | INTRAVENOUS | Status: AC
  Administered 2023-07-18: 40 mg via INTRAVENOUS
  Filled 2023-07-18: qty 10

## 2023-07-18 MED ORDER — LOPERAMIDE HCL 2 MG PO TABS: 4.0000 mg | ORAL_TABLET | Freq: Four times a day (QID) | ORAL | 0 refills | Status: DC | PRN

## 2023-07-18 MED ORDER — SODIUM CHLORIDE 0.9 % IV BOLUS
1000.0000 mL | Freq: Once | INTRAVENOUS | Status: AC
  Administered 2023-07-18: 1000 mL via INTRAVENOUS

## 2023-07-18 MED ORDER — ONDANSETRON HCL 4 MG/2ML IJ SOLN
4.0000 mg | Freq: Once | INTRAMUSCULAR | Status: AC
  Administered 2023-07-18: 4 mg via INTRAVENOUS
  Filled 2023-07-18: qty 2

## 2023-07-18 NOTE — ED Notes (Signed)
Passed PO challenge

## 2023-07-18 NOTE — ED Triage Notes (Signed)
Pt complains of headache, nausea, and vomiting since Thursday. States he noticed bright red blood in stool today.

## 2023-07-18 NOTE — ED Notes (Signed)
Water and saltines provided for PO challenge

## 2023-07-18 NOTE — ED Provider Notes (Signed)
[facility] Provider Note    Event Date/Time   First MD Initiated Contact with Patient 07/18/23 1614     (approximate)   History   Chief Complaint: Abdominal Pain and Nausea   HPI  Johnny Payne is a 27 y.o. male with no significant past medical history who comes ED complaining of nausea vomiting upper abdominal pain headache and chills for the past 2 days.  Constant, waxing and waning.  Also reports diarrhea which is watery.  No significant chest pain or shortness of breath.  Also endorses early satiety for the past month.     Physical Exam   Triage Vital Signs: ED Triage Vitals  Encounter Vitals Group     BP 07/18/23 1543 116/70     Systolic BP Percentile --      Diastolic BP Percentile --      Pulse Rate 07/18/23 1543 (!) 103     Resp 07/18/23 1543 20     Temp 07/18/23 1543 (!) 101.7 F (38.7 C)     Temp Source 07/18/23 1543 Oral     SpO2 07/18/23 1543 94 %     Weight 07/18/23 1540 160 lb (72.6 kg)     Height 07/18/23 1540 5\' 11"  (1.803 m)     Head Circumference --      Peak Flow --      Pain Score 07/18/23 1539 10     Pain Loc --      Pain Education --      Exclude from Growth Chart --     Most recent vital signs: Vitals:   07/18/23 1730 07/18/23 1900  BP: 120/73   Pulse: 81 77  Resp: 13 13  Temp:    SpO2: 99% 99%    General: Awake, no distress.  CV:  Good peripheral perfusion.  Tachycardia heart rate 102 Resp:  Normal effort.  Clear to auscultation bilaterally Abd:  No distention.  Soft with epigastric and right upper quadrant tenderness Other:  No lower extremity edema.  Somewhat dry oral mucosa   ED Results / Procedures / Treatments   Labs (all labs ordered are listed, but only abnormal results are displayed) Labs Reviewed  CBC WITH DIFFERENTIAL/PLATELET - Abnormal; Notable for the following components:      Result Value   RBC 5.89 (*)    All other components within normal limits  BASIC METABOLIC PANEL - Abnormal;  Notable for the following components:   Sodium 134 (*)    Glucose, Bld 110 (*)    Calcium 8.7 (*)    All other components within normal limits  URINALYSIS, ROUTINE W REFLEX MICROSCOPIC - Abnormal; Notable for the following components:   Color, Urine AMBER (*)    APPearance HAZY (*)    Specific Gravity, Urine 1.035 (*)    Ketones, ur 20 (*)    Protein, ur 100 (*)    All other components within normal limits  SARS CORONAVIRUS 2 BY RT PCR  LIPASE, BLOOD  HEPATIC FUNCTION PANEL     EKG    RADIOLOGY Ultrasound right upper quadrant interpreted by me, negative for gallstones or cholecystitis.  Radiology report reviewed   PROCEDURES:  Procedures   MEDICATIONS ORDERED IN ED: Medications  acetaminophen (TYLENOL) tablet 650 mg (650 mg Oral Given 07/18/23 1555)  ketorolac (TORADOL) 15 MG/ML injection 15 mg (15 mg Intravenous Given 07/18/23 1703)  ondansetron (ZOFRAN) injection 4 mg (4 mg Intravenous Given 07/18/23 1702)  pantoprazole (PROTONIX) injection 40 mg (40  mg Intravenous Given 07/18/23 1658)  sodium chloride 0.9 % bolus 1,000 mL (1,000 mLs Intravenous New Bag/Given 07/18/23 1658)     IMPRESSION / MDM / ASSESSMENT AND PLAN / ED COURSE  I reviewed the triage vital signs and the nursing notes.  DDx: COVID, viral gastroenteritis, dehydration, AKI, electrolyte abnormality, cholecystitis, pancreatitis  Patient's presentation is most consistent with acute presentation with potential threat to life or bodily function.  Patient presents with upper abdominal pain, vomiting diarrhea.  Doubt mesenteric ischemia, aneurysm, dissection.  Has a fever and abdominal tenderness so will obtain ultrasound right upper quadrant along with labs while giving fluids, PPI, Zofran, Toradol.   ----------------------------------------- 8:00 PM on 07/18/2023 ----------------------------------------- Tolerating oral intake, ambulatory.  Reassuring workup, stable for discharge.      FINAL CLINICAL  IMPRESSION(S) / ED DIAGNOSES   Final diagnoses:  Viral gastroenteritis     Rx / DC Orders   ED Discharge Orders          Ordered    ondansetron (ZOFRAN-ODT) 4 MG disintegrating tablet  Every 8 hours PRN        07/18/23 2000    loperamide (IMODIUM A-D) 2 MG tablet  4 times daily PRN        07/18/23 2000             Note:  This document was prepared using Dragon voice recognition software and may include unintentional dictation errors.   Sharman Cheek, MD 07/18/23 2001

## 2023-07-18 NOTE — ED Notes (Signed)
US at bedside

## 2023-07-18 NOTE — ED Notes (Signed)
ED Provider at bedside. 

## 2023-07-22 ENCOUNTER — Emergency Department
Admission: EM | Admit: 2023-07-22 | Discharge: 2023-07-22 | Disposition: A | Discharge status: Home / Self Care | Payer: Self-pay | Attending: Emergency Medicine | Admitting: Emergency Medicine

## 2023-07-22 ENCOUNTER — Other Ambulatory Visit: Payer: Self-pay

## 2023-07-22 ENCOUNTER — Encounter: Payer: Self-pay | Admitting: Emergency Medicine

## 2023-07-22 ENCOUNTER — Emergency Department: Payer: Self-pay

## 2023-07-22 DIAGNOSIS — K5903 Drug induced constipation: Secondary | ICD-10-CM | POA: Insufficient documentation

## 2023-07-22 DIAGNOSIS — R519 Headache, unspecified: Secondary | ICD-10-CM | POA: Insufficient documentation

## 2023-07-22 DIAGNOSIS — E876 Hypokalemia: Secondary | ICD-10-CM | POA: Insufficient documentation

## 2023-07-22 LAB — CBC
HCT: 43.2 % (ref 39.0–52.0)
Hemoglobin: 14.6 g/dL (ref 13.0–17.0)
MCH: 27.9 pg (ref 26.0–34.0)
MCHC: 33.8 g/dL (ref 30.0–36.0)
MCV: 82.6 fL (ref 80.0–100.0)
Platelets: 278 10*3/uL (ref 150–400)
RBC: 5.23 MIL/uL (ref 4.22–5.81)
RDW: 12 % (ref 11.5–15.5)
WBC: 10.6 10*3/uL — ABNORMAL HIGH (ref 4.0–10.5)
nRBC: 0.2 % (ref 0.0–0.2)

## 2023-07-22 LAB — COMPREHENSIVE METABOLIC PANEL
ALT: 12 U/L (ref 0–44)
AST: 12 U/L — ABNORMAL LOW (ref 15–41)
Albumin: 3.6 g/dL (ref 3.5–5.0)
Alkaline Phosphatase: 59 U/L (ref 38–126)
Anion gap: 17 — ABNORMAL HIGH (ref 5–15)
BUN: 9 mg/dL (ref 6–20)
CO2: 22 mmol/L (ref 22–32)
Calcium: 8.7 mg/dL — ABNORMAL LOW (ref 8.9–10.3)
Chloride: 97 mmol/L — ABNORMAL LOW (ref 98–111)
Creatinine, Ser: 0.97 mg/dL (ref 0.61–1.24)
GFR, Estimated: >60 mL/min (ref >60)
Glucose, Bld: 69 mg/dL — ABNORMAL LOW (ref 70–99)
Potassium: 3.2 mmol/L — ABNORMAL LOW (ref 3.5–5.1)
Sodium: 136 mmol/L (ref 135–145)
Total Bilirubin: 1.2 mg/dL (ref 0.3–1.2)
Total Protein: 7.5 g/dL (ref 6.5–8.1)

## 2023-07-22 MED ORDER — IOHEXOL 350 MG/ML SOLN
75.0000 mL | Freq: Once | INTRAVENOUS | Status: AC | PRN
  Administered 2023-07-22: 75 mL via INTRAVENOUS

## 2023-07-22 MED ORDER — SODIUM CHLORIDE 0.9 % IV BOLUS
1000.0000 mL | Freq: Once | INTRAVENOUS | Status: AC
  Administered 2023-07-22: 1000 mL via INTRAVENOUS

## 2023-07-22 MED ORDER — PROCHLORPERAZINE EDISYLATE 10 MG/2ML IJ SOLN
5.0000 mg | Freq: Once | INTRAMUSCULAR | Status: AC
  Administered 2023-07-22: 5 mg via INTRAVENOUS
  Filled 2023-07-22: qty 2

## 2023-07-22 MED ORDER — KETOROLAC TROMETHAMINE 15 MG/ML IJ SOLN
15.0000 mg | Freq: Once | INTRAMUSCULAR | Status: AC
  Administered 2023-07-22: 15 mg via INTRAVENOUS
  Filled 2023-07-22: qty 1

## 2023-07-22 MED ORDER — DEXTROSE-NACL 5-0.45 % IV SOLN
INTRAVENOUS | Status: DC
  Filled 2023-07-22 (×2): qty 1000

## 2023-07-22 MED ORDER — POTASSIUM CHLORIDE CRYS ER 20 MEQ PO TBCR
40.0000 meq | EXTENDED_RELEASE_TABLET | Freq: Once | ORAL | Status: AC
  Administered 2023-07-22: 40 meq via ORAL
  Filled 2023-07-22: qty 2

## 2023-07-22 NOTE — ED Notes (Signed)
Pt given crackers and water by Medic.

## 2023-07-22 NOTE — Discharge Instructions (Addendum)
You were seen in the emergency department for abdominal pain and headache.  You had a CT scan of your head that did not show any abnormalities.  You are given medication through your IV and had significant improvement of your headache.  You are also having symptoms of abdominal pain.  Concern this is from constipation and taking Imodium.  Stop taking Imodium.  Drink plenty of fluids and stay hydrated.  Start MiraLAX as we discussed.  Return to the emergency department for any ongoing symptoms or return of symptoms.  Constipation management - take MiraLAX (4 capfulls) mixed with 32-64 ounces of fluid.  Drink this entire drink.  If you do not have a bowel movement that day you can repeat the next day and add 2 capfuls for a total of 6 capfuls of MiraLAX.  MiraLAX does not work if you do not drink plenty of fluids with it.   Your potassium was mildly low when checked today.  Make sure to follow up with a primary doctor to follow up your labs.  Make sure to eat food high in potassium and magnesium - examples - potatoes, spinach, bananas, beans, avocadoes, oranges, nuts.  Thank you for choosing [facility] for your health care, it was my pleasure to care for you today!  Corena Herter, MD

## 2023-07-22 NOTE — ED Provider Notes (Signed)
[facility] Provider Note    Event Date/Time   First MD Initiated Contact with Patient 07/22/23 1507     (approximate)   History   Constipation and Migraine   HPI  Johnny Payne is a 27 y.o. male no significant past medical history presents to the emergency department with multiple complaints.  Patient was evaluated last week and diagnosed with a viral gastroenteritis.  States that since Thursday he has not been feeling well.  Complaining of a headache that has been ongoing.  States that he has gotten headaches in the past but this feels slightly different.  Describing a throbbing headache to the right side.  Not sudden in onset and not the worst headache of his life.  Does endorse some feeling of feeling off balance whenever he walks over the past couple of days.  Denies any change in vision, extremity numbness or weakness.  Denies any falls or trauma.  Complaining of abdominal pain that is diffuse.  States that he has been constipated and his last bowel movement was 1 week ago.  States that he has been taking Imodium that was prescribed to him every 4 hours and he is concerned that this might be worsening the situation.  Denies any ongoing fever.  No blood in his stool.  Denies any episodes of nausea or vomiting.     Physical Exam   Triage Vital Signs: ED Triage Vitals  Encounter Vitals Group     BP 07/22/23 1433 109/67     Systolic BP Percentile --      Diastolic BP Percentile --      Pulse Rate 07/22/23 1433 (!) 106     Resp 07/22/23 1433 20     Temp 07/22/23 1433 98.2 F (36.8 C)     Temp Source 07/22/23 1433 Oral     SpO2 07/22/23 1433 97 %     Weight 07/22/23 1434 160 lb (72.6 kg)     Height --      Head Circumference --      Peak Flow --      Pain Score 07/22/23 1433 8     Pain Loc --      Pain Education --      Exclude from Growth Chart --     Most recent vital signs: Vitals:   07/22/23 1433 07/22/23 1800  BP: 109/67 (!) 104/54   Pulse: (!) 106 95  Resp: 20 18  Temp: 98.2 F (36.8 C) 98.5 F (36.9 C)  SpO2: 97% 98%    Physical Exam Constitutional:      Appearance: He is well-developed.  HENT:     Head: Atraumatic.  Eyes:     Conjunctiva/sclera: Conjunctivae normal.  Cardiovascular:     Rate and Rhythm: Regular rhythm.  Pulmonary:     Effort: No respiratory distress.  Abdominal:     Tenderness: There is abdominal tenderness (Mild diffuse abdominal tenderness to palpation.  Negative McBurney's point.  No CVA tenderness.).  Musculoskeletal:        General: Normal range of motion.     Cervical back: Normal range of motion.  Skin:    General: Skin is warm.  Neurological:     Mental Status: He is alert. Mental status is at baseline.     GCS: GCS eye subscore is 4. GCS verbal subscore is 5. GCS motor subscore is 6.     Cranial Nerves: Cranial nerves 2-12 are intact.     Sensory: Sensation is  intact.     Motor: Motor function is intact.     Coordination: Coordination is intact.     Gait: Gait abnormal.     Comments: No nystagmus.  Pupils are equal and reactive.  Did have an abnormal gait with ambulation, leaning to the right side.     IMPRESSION / MDM / ASSESSMENT AND PLAN / ED COURSE  I reviewed the triage vital signs and the nursing notes.  Differential diagnosis including electrolyte abnormality, dehydration, constipation secondary to medication side effect of Imodium, subarachnoid hemorrhage, vertebral dissection    No tachycardic or bradycardic dysrhythmias while on cardiac telemetry.  RADIOLOGY I independently reviewed imaging, my interpretation of imaging: CTA head -no obvious large vessel dissection.  No obvious intracranial hemorrhage.  Read as no acute findings.  LABS (all labs ordered are listed, but only abnormal results are displayed) Labs interpreted as -    Labs Reviewed  CBC - Abnormal; Notable for the following components:      Result Value   WBC 10.6 (*)    All other  components within normal limits  COMPREHENSIVE METABOLIC PANEL - Abnormal; Notable for the following components:   Potassium 3.2 (*)    Chloride 97 (*)    Glucose, Bld 69 (*)    Calcium 8.7 (*)    AST 12 (*)    Anion gap 17 (*)    All other components within normal limits     MDM  On chart review patient was evaluated and had a fever with nausea vomiting and diarrhea on Thursday.  Diagnosed with viral gastroenteritis.  Concerned that his abdominal pain is from constipation, last bowel movement was 1 week ago.  No nausea or vomiting, no prior abdominal surgery and have a low suspicion for small bowel obstruction.  Passing flatus.  Most likely constipation secondary to Imodium use.  Treated with IV fluids, migraine cocktail   Clinical Course as of 07/22/23 1848  Wed Jul 22, 2023  1746 On reevaluation states his headache is feeling much better.  Currently rates his pain as very mild.  States is much more tolerable. [SM]    Clinical Course User Index [SM] Corena Herter, MD   Tolerating p.o.  Patient did have an elevated anion gap and low glucose.  States that he is not been eating all week because he is uncertain of what to eat for his abdominal pain.  Likely starvation ketosis.  Patient tolerating p.o.  Initially received D5 half-normal saline given his low glucose level and waiting for a CT scan to return.  Able to tolerate p.o. without any difficulties.  States he is feeling much better.  Repeat abdominal exam with mild diffuse abdominal tenderness to palpation with no rebound or guarding.  Negative McBurney's point have a low suspicion for acute appendicitis.  Able to ambulate in the emergency department.  Continues to have no nystagmus.  Discharged home in stable condition with return precautions.  PROCEDURES:  Critical Care performed: No  Procedures  Patient's presentation is most consistent with acute presentation with potential threat to life or bodily  function.   MEDICATIONS ORDERED IN ED: Medications  dextrose 5 %-0.45 % sodium chloride infusion ( Intravenous New Bag/Given 07/22/23 1752)  sodium chloride 0.9 % bolus 1,000 mL (0 mLs Intravenous Stopped 07/22/23 1718)  ketorolac (TORADOL) 15 MG/ML injection 15 mg (15 mg Intravenous Given 07/22/23 1603)  prochlorperazine (COMPAZINE) injection 5 mg (5 mg Intravenous Given 07/22/23 1605)  iohexol (OMNIPAQUE) 350 MG/ML injection 75 mL (  75 mLs Intravenous Contrast Given 07/22/23 1645)  potassium chloride SA (KLOR-CON M) CR tablet 40 mEq (40 mEq Oral Given 07/22/23 1750)    FINAL CLINICAL IMPRESSION(S) / ED DIAGNOSES   Final diagnoses:  Drug-induced constipation  Acute nonintractable headache, unspecified headache type  Hypokalemia     Rx / DC Orders   ED Discharge Orders          Ordered    Ambulatory Referral to Primary Care (Establish Care)        07/22/23 1847             Note:  This document was prepared using Dragon voice recognition software and may include unintentional dictation errors.   Corena Herter, MD 07/22/23 [phone]

## 2023-07-22 NOTE — ED Triage Notes (Signed)
Pt in with continued migraines, constipation and lightheadedness since visit on 9/28. Pt states the Imodium and Zofran he was sent home with are not helping. Denies any diarrhea, and states he has been taking Imodium q4hr at home, last BM 1 wk ago.

## 2023-07-26 ENCOUNTER — Other Ambulatory Visit (HOSPITAL_COMMUNITY): Payer: Self-pay

## 2023-07-26 ENCOUNTER — Other Ambulatory Visit: Payer: Self-pay

## 2023-07-26 ENCOUNTER — Emergency Department (HOSPITAL_COMMUNITY)
Admission: EM | Admit: 2023-07-26 | Discharge: 2023-07-26 | Disposition: A | Discharge status: Home / Self Care | Payer: Self-pay | Attending: Emergency Medicine | Admitting: Emergency Medicine

## 2023-07-26 ENCOUNTER — Emergency Department (HOSPITAL_COMMUNITY): Payer: Self-pay

## 2023-07-26 ENCOUNTER — Encounter (HOSPITAL_COMMUNITY): Payer: Self-pay

## 2023-07-26 DIAGNOSIS — M25469 Effusion, unspecified knee: Secondary | ICD-10-CM

## 2023-07-26 DIAGNOSIS — M25561 Pain in right knee: Secondary | ICD-10-CM | POA: Insufficient documentation

## 2023-07-26 DIAGNOSIS — M25461 Effusion, right knee: Secondary | ICD-10-CM | POA: Insufficient documentation

## 2023-07-26 LAB — CBC
HCT: 43.8 % (ref 39.0–52.0)
Hemoglobin: 14.8 g/dL (ref 13.0–17.0)
MCH: 28.1 pg (ref 26.0–34.0)
MCHC: 33.8 g/dL (ref 30.0–36.0)
MCV: 83.1 fL (ref 80.0–100.0)
Platelets: 365 10*3/uL (ref 150–400)
RBC: 5.27 MIL/uL (ref 4.22–5.81)
RDW: 12.4 % (ref 11.5–15.5)
WBC: 11.3 10*3/uL — ABNORMAL HIGH (ref 4.0–10.5)
nRBC: 0 % (ref 0.0–0.2)

## 2023-07-26 LAB — SEDIMENTATION RATE: Sed Rate: 37 mm/h — ABNORMAL HIGH (ref 0–16)

## 2023-07-26 LAB — C-REACTIVE PROTEIN: CRP: 10.5 mg/dL — ABNORMAL HIGH (ref <1.0)

## 2023-07-26 MED ORDER — PREDNISONE 10 MG PO TABS
50.0000 mg | ORAL_TABLET | Freq: Every day | ORAL | 0 refills | Status: AC
Start: 2023-07-26 — End: 2023-08-01
  Filled 2023-07-26: qty 25, 5d supply, fill #0

## 2023-07-26 MED ORDER — IBUPROFEN 800 MG PO TABS
800.0000 mg | ORAL_TABLET | Freq: Once | ORAL | Status: AC
  Administered 2023-07-26: 800 mg via ORAL
  Filled 2023-07-26: qty 1

## 2023-07-26 MED ORDER — PREDNISONE 20 MG PO TABS
60.0000 mg | ORAL_TABLET | Freq: Once | ORAL | Status: AC
  Administered 2023-07-26: 60 mg via ORAL
  Filled 2023-07-26: qty 3

## 2023-07-26 NOTE — Discharge Instructions (Addendum)
Evaluation today revealed it is likely gout given causing her symptoms. Recommend you take prednisone for the next 5 days. Also take ibuprofen 800 mg every 8 hours for the next 3 to 5 days as well.  Please follow-up with Ortho.  If your knee becomes red, hot to touch, you develop a fever or you have any new trauma to your knee or any other concerning symptom please return emerged part further evaluation.

## 2023-07-26 NOTE — ED Provider Notes (Signed)
[place] EMERGENCY DEPARTMENT AT [facility] Provider Note   CSN: [id number] Arrival date & time: 07/26/23  1518     History  Chief Complaint  Patient presents with   Joint Swelling   HPI Johnny Payne is a 27 y.o. male with history of right knee effusion presenting for right knee swelling.  Started last night.  Denies any associated trauma.  The knee is painful as well.  Denies fever or chills.  States he did recently have a "stomach bug" and was later diagnosed with drug-induced constipation.  Also states that he is sexually active.  Reports last encounter was about 2 weeks ago.  Reports he did develop a similar effusion in his right knee over a year ago.  States his knee pain feels similar to when he was here last year and had his knee drained.  States he wanted "to be seen before it got worse".  HPI     Home Medications Prior to Admission medications   Medication Sig Start Date End Date Taking? Authorizing Provider  loperamide (IMODIUM A-D) 2 MG tablet Take 2 tablets (4 mg total) by mouth 4 (four) times daily as needed for diarrhea or loose stools. 07/18/23   Sharman Cheek, MD  ondansetron (ZOFRAN-ODT) 4 MG disintegrating tablet Take 1 tablet (4 mg total) by mouth every 8 (eight) hours as needed for nausea or vomiting. 07/18/23   Sharman Cheek, MD      Allergies    Vancomycin    Review of Systems   See HPI for pertinent positives  Physical Exam Updated Vital Signs BP 116/74   Pulse 83   Temp 99.4 F (37.4 C) (Oral)   Resp 16   Ht 5\' 11"  (1.803 m)   Wt 72.6 kg   SpO2 97%   BMI 22.32 kg/m  Physical Exam Constitutional:      Appearance: Normal appearance.  HENT:     Head: Normocephalic.     Nose: Nose normal.  Eyes:     Conjunctiva/sclera: Conjunctivae normal.  Pulmonary:     Effort: Pulmonary effort is normal.  Musculoskeletal:     Right knee: Swelling present. No effusion. Normal range of motion. Tenderness present. Normal pulse.     Left  knee: Normal pulse.     Comments: Right knee is not hot to touch or erythematous.  No areas of notable fluctuance.  No palpable effusion.  Neurological:     Mental Status: He is alert.  Psychiatric:        Mood and Affect: Mood normal.     ED Results / Procedures / Treatments   Labs (all labs ordered are listed, but only abnormal results are displayed) Labs Reviewed  CBC - Abnormal; Notable for the following components:      Result Value   WBC 11.3 (*)    All other components within normal limits  SEDIMENTATION RATE - Abnormal; Notable for the following components:   Sed Rate 37 (*)    All other components within normal limits  C-REACTIVE PROTEIN - Abnormal; Notable for the following components:   CRP 10.5 (*)    All other components within normal limits    EKG None  Radiology DG Knee Complete 4 Views Right  Result Date: 07/26/2023 CLINICAL DATA:  Pain and swelling.  No known injury. EXAM: RIGHT KNEE - COMPLETE 4+ VIEW COMPARISON:  12/28/2021 FINDINGS: No evidence of fracture or dislocation. No evidence of arthropathy or other focal bone abnormality. No erosive change. Small knee  joint effusion. Soft tissues are unremarkable. IMPRESSION: Small knee joint effusion. No acute osseous abnormality. Electronically Signed   By: Narda Rutherford M.D.   On: 07/26/2023 18:38    Procedures Procedures    Medications Ordered in ED Medications  predniSONE (DELTASONE) tablet 60 mg (has no administration in time range)  ibuprofen (ADVIL) tablet 800 mg (800 mg Oral Given 07/26/23 1737)    ED Course/ Medical Decision Making/ A&P                                 Medical Decision Making Amount and/or Complexity of Data Reviewed Labs: ordered. Radiology: ordered.  Risk Prescription drug management.   27 year old well-appearing male presenting for right knee swelling and pain.  Exam notable for mild edema and tenderness about the right knee joint.  DDx includes septic arthritis, gout,  fracture dislocation, other.  Overall the knee does not appear infected.  Suspect this may be a gout flare.  X-ray did demonstrate a small effusion.  Felt this time the risk of introducing infection with aspiration procedure would be greater than possible benefit.  Most likely gout flare.  Treated with ibuprofen and prednisone. Advised to continue ibuprofen and prednisone at home for the next 5 days.  Advised him to follow-up with orthopedics as well for reevaluation.  Discussed return precautions.  Vital stable.  Discharged home in good condition.        Final Clinical Impression(s) / ED Diagnoses Final diagnoses:  Right knee pain, unspecified chronicity  Knee swelling    Rx / DC Orders ED Discharge Orders     None         Gareth Eagle, PA-C 07/26/23 1942    Terald Sleeper, MD 07/26/23 [phone]

## 2023-07-26 NOTE — ED Triage Notes (Signed)
Pt stated his right knee started swelling last night. Pt states it has happened in the past and had his knee drained. Pt denies fever/chills and no injury. Pt states he is getting over a stomach bug.

## 2023-07-27 ENCOUNTER — Other Ambulatory Visit (HOSPITAL_COMMUNITY): Payer: Self-pay

## 2023-07-29 ENCOUNTER — Encounter (HOSPITAL_BASED_OUTPATIENT_CLINIC_OR_DEPARTMENT_OTHER): Payer: Self-pay | Admitting: Student

## 2023-07-29 ENCOUNTER — Ambulatory Visit (INDEPENDENT_AMBULATORY_CARE_PROVIDER_SITE_OTHER): Payer: Self-pay | Admitting: Student

## 2023-07-29 DIAGNOSIS — M25561 Pain in right knee: Secondary | ICD-10-CM

## 2023-07-29 NOTE — Progress Notes (Signed)
Chief Complaint: Right knee pain     History of Present Illness:    Johnny Payne is a 27 y.o. male presenting today with pain and swelling of the right knee.  This began about 4 days ago with no injury.  He was seen in the emergency department on 10/6 and was started on oral prednisone as well as ibuprofen.  He did use a compression wrap yesterday.  States that overall the swelling and pain remained about the same.  He does have history of gout and was treated for this in the right knee at our clinic last year.  Pain is located throughout the knee and has decreased range of motion.  Denies any fever or chills.  Surgical History:   None  PMH/PSH/Family History/Social History/Meds/Allergies:    Past Medical History:  Diagnosis Date   Eczema    History reviewed. No pertinent surgical history. Social History   Socioeconomic History   Marital status: Single    Spouse name: Not on file   Number of children: Not on file   Years of education: Not on file   Highest education level: Not on file  Occupational History   Not on file  Tobacco Use   Smoking status: Never   Smokeless tobacco: Not on file  Substance and Sexual Activity   Alcohol use: No   Drug use: No   Sexual activity: Never  Other Topics Concern   Not on file  Social History Narrative   Not on file   Social Determinants of Health   Financial Resource Strain: Not on file  Food Insecurity: Not on file  Transportation Needs: Not on file  Physical Activity: Not on file  Stress: Not on file  Social Connections: Unknown (03/03/2022)   Received from [facility], [facility]   Social Network    Social Network: Not on file   Family History  Problem Relation Age of Onset   Asthma Maternal Uncle    Cancer Maternal Grandfather    Allergies  Allergen Reactions   Vancomycin Swelling    Face, mouth and throat   Current Outpatient Medications  Medication Sig Dispense Refill    loperamide (IMODIUM A-D) 2 MG tablet Take 2 tablets (4 mg total) by mouth 4 (four) times daily as needed for diarrhea or loose stools. 30 tablet 0   ondansetron (ZOFRAN-ODT) 4 MG disintegrating tablet Take 1 tablet (4 mg total) by mouth every 8 (eight) hours as needed for nausea or vomiting. 20 tablet 0   predniSONE (DELTASONE) 10 MG tablet Take 5 tablets (50 mg total) by mouth daily for 5 days. 25 tablet 0   No current facility-administered medications for this visit.   No results found.  Review of Systems:   A ROS was performed including pertinent positives and negatives as documented in the HPI.  Physical Exam :   Constitutional: NAD and appears stated age Neurological: Alert and oriented Psych: Appropriate affect and cooperative There were no vitals taken for this visit.   Comprehensive Musculoskeletal Exam:    Moderate effusion present of the right knee.  Mild warmth but no erythema present.  Active range of motion from 20 to 90 degrees.  Negative joint line tenderness.  Patient nonweightbearing due to pain.  Distal neurosensory exam intact.  Imaging:   Xray review from  07/26/2023 (right knee 4 views): Positive effusion.  Otherwise negative   I personally reviewed and interpreted the radiographs.   Assessment:   26 y.o. male with atraumatic right knee pain and swelling.  Given his history of gout, I am highly suspicious that this is the cause.  No signs of infection today.  He is currently on a course of oral prednisone but has not seen much improvement so far.  Given the effusion present, I have recommended aspiration today.  Patient is agreeable and was able to aspirate 50 mL of cloudy synovial fluid.  Advised him to finish course of prednisone as well as use compression wraps.  I will plan to send for synovial fluid analysis to confirm diagnosis.  Will update patient with results and will reassess treatment plan at that time.  Plan :    -Knee aspirated today and sent for  fluid analysis -Return to clinic as needed    Procedure Note  Patient: Brenin Mccarley             Date of Birth: 1995/12/16           MRN: [id number]             Visit Date: 07/29/2023  Procedures: Visit Diagnoses:  1. Acute pain of right knee     Large Joint Inj: R knee on 07/29/2023 12:57 PM Indications: pain and joint swelling Details: 18 G 1.5 in needle, superolateral approach Aspirate: 50 mL yellow and cloudy; sent for lab analysis Outcome: tolerated well, no immediate complications Procedure, treatment alternatives, risks and benefits explained, specific risks discussed. Consent was given by the patient. Immediately prior to procedure a time out was called to verify the correct patient, procedure, equipment, support staff and site/side marked as required. Patient was prepped and draped in the usual sterile fashion.      I personally saw and evaluated the patient, and participated in the management and treatment plan.  Hazle Nordmann, PA-C Orthopedics

## 2023-08-04 ENCOUNTER — Ambulatory Visit (INDEPENDENT_AMBULATORY_CARE_PROVIDER_SITE_OTHER): Payer: Self-pay

## 2023-08-04 ENCOUNTER — Ambulatory Visit (INDEPENDENT_AMBULATORY_CARE_PROVIDER_SITE_OTHER): Payer: Self-pay | Admitting: Student

## 2023-08-04 ENCOUNTER — Encounter (HOSPITAL_BASED_OUTPATIENT_CLINIC_OR_DEPARTMENT_OTHER): Payer: Self-pay | Admitting: Student

## 2023-08-04 DIAGNOSIS — M25562 Pain in left knee: Secondary | ICD-10-CM

## 2023-08-04 DIAGNOSIS — M25462 Effusion, left knee: Secondary | ICD-10-CM

## 2023-08-04 NOTE — Progress Notes (Signed)
Chief Complaint: Left knee pain and swelling     History of Present Illness:    Johnny Payne is a 27 y.o. male presenting today with left knee pain and swelling.  I did see him in clinic last week for atraumatic pain and swelling of the right knee.  This was aspirated and sent for synovial fluid analysis.  Results showed elevated nucleated cell count of 47,180 but with 43% neutrophils and no growth on culture.  His right knee is feeling mostly better, however his left knee started bothering him about 3 days ago and became much more painful yesterday.  Pain started on the lateral side of the knee but is now diffuse with significant swelling.  He is having difficulty weightbearing.  He did finish course of prednisone yesterday and has not been taking any other medications.  Denies any fever, chills, or fatigue.   Surgical History:   None  PMH/PSH/Family History/Social History/Meds/Allergies:    Past Medical History:  Diagnosis Date   Eczema    History reviewed. No pertinent surgical history. Social History   Socioeconomic History   Marital status: Single    Spouse name: Not on file   Number of children: Not on file   Years of education: Not on file   Highest education level: Not on file  Occupational History   Not on file  Tobacco Use   Smoking status: Never   Smokeless tobacco: Not on file  Substance and Sexual Activity   Alcohol use: No   Drug use: No   Sexual activity: Never  Other Topics Concern   Not on file  Social History Narrative   Not on file   Social Determinants of Health   Financial Resource Strain: Not on file  Food Insecurity: Not on file  Transportation Needs: Not on file  Physical Activity: Not on file  Stress: Not on file  Social Connections: Unknown (03/03/2022)   Received from [facility], [facility]   Social Network    Social Network: Not on file   Family History  Problem Relation Age of Onset    Asthma Maternal Uncle    Cancer Maternal Grandfather    Allergies  Allergen Reactions   Vancomycin Swelling    Face, mouth and throat   Current Outpatient Medications  Medication Sig Dispense Refill   loperamide (IMODIUM A-D) 2 MG tablet Take 2 tablets (4 mg total) by mouth 4 (four) times daily as needed for diarrhea or loose stools. 30 tablet 0   ondansetron (ZOFRAN-ODT) 4 MG disintegrating tablet Take 1 tablet (4 mg total) by mouth every 8 (eight) hours as needed for nausea or vomiting. 20 tablet 0   No current facility-administered medications for this visit.   No results found.  Review of Systems:   A ROS was performed including pertinent positives and negatives as documented in the HPI.  Physical Exam :   Constitutional: NAD and appears stated age Neurological: Alert and oriented Psych: Appropriate affect and cooperative There were no vitals taken for this visit.   Comprehensive Musculoskeletal Exam:    Left knee appears swollen with a moderate to large effusion present.  Slight warmth to palpation but no erythema.  Active range of motion from 10 to 90 degrees.  Tenderness over the lateral knee and lateral joint line.  Distal  neurosensory exam intact.  Imaging:   Xray (left knee 4 views): Effusion present.  Negative for bony abnormality.   I personally reviewed and interpreted the radiographs.   Assessment:   27 y.o. male with atraumatic pain left knee.  There is a significant effusion present and I do believe he would benefit from an aspiration.  His right knee was aspirated last week and has been doing better.  I was able to aspirate 80 mL of cloudy fluid which I will plan to send for synovial analysis including cell counts and crystals.  He does have a history of gout but fluid taken from the right knee was negative for crystals.  Will follow-up with results and he can return as needed.  Plan :    - 80 mL aspirated from left knee today and sent for synovial fluid  analysis - Return to clinic as needed     Procedure Note  Patient: Johnny Payne             Date of Birth: 1996-02-20           MRN: [id number]             Visit Date: 08/04/2023  Procedures: Visit Diagnoses:  1. Acute pain of left knee   2. Effusion, left knee     Large Joint Inj: L knee on 08/04/2023 4:56 PM Indications: pain Details: 18 G 1.5 in needle, superolateral approach Aspirate: 80 mL cloudy and yellow Outcome: tolerated well, no immediate complications Procedure, treatment alternatives, risks and benefits explained, specific risks discussed. Consent was given by the patient. Immediately prior to procedure a time out was called to verify the correct patient, procedure, equipment, support staff and site/side marked as required. Patient was prepped and draped in the usual sterile fashion.       I personally saw and evaluated the patient, and participated in the management and treatment plan.  Hazle Nordmann, PA-C Orthopedics

## 2023-08-05 ENCOUNTER — Telehealth (HOSPITAL_BASED_OUTPATIENT_CLINIC_OR_DEPARTMENT_OTHER): Payer: Self-pay | Admitting: Student

## 2023-08-05 ENCOUNTER — Other Ambulatory Visit (HOSPITAL_BASED_OUTPATIENT_CLINIC_OR_DEPARTMENT_OTHER): Payer: Self-pay | Admitting: Student

## 2023-08-05 MED ORDER — MELOXICAM 15 MG PO TABS: 15.0000 mg | ORAL_TABLET | Freq: Every day | ORAL | 0 refills | Status: AC

## 2023-08-05 NOTE — Telephone Encounter (Signed)
Patient called about the meds he was suppose to start from his last visit. Patient best contact number [phone]

## 2023-08-05 NOTE — Telephone Encounter (Signed)
Called pt. [facility] in [facility]

## 2023-08-17 ENCOUNTER — Telehealth: Payer: Self-pay | Admitting: Student

## 2023-08-17 NOTE — Telephone Encounter (Signed)
Pt called in for Johnny Payne wanting to know if his test results came back from the fluid that was drawn form his left knee and his knees are still weak and in pain, has him out of work please advise on next steps

## 2023-08-17 NOTE — Telephone Encounter (Signed)
I called [facility], they are checking on these labs

## 2023-08-17 NOTE — Telephone Encounter (Signed)
Spoke to patient.  Waiting on results from [facility].  Scheduled follow up visit for 11/1.

## 2023-08-21 ENCOUNTER — Ambulatory Visit (INDEPENDENT_AMBULATORY_CARE_PROVIDER_SITE_OTHER): Payer: Self-pay | Admitting: Student

## 2023-08-21 ENCOUNTER — Encounter (HOSPITAL_BASED_OUTPATIENT_CLINIC_OR_DEPARTMENT_OTHER): Payer: Self-pay | Admitting: Student

## 2023-08-21 DIAGNOSIS — M25562 Pain in left knee: Secondary | ICD-10-CM

## 2023-08-21 DIAGNOSIS — M25561 Pain in right knee: Secondary | ICD-10-CM

## 2023-08-21 DIAGNOSIS — Z8739 Personal history of other diseases of the musculoskeletal system and connective tissue: Secondary | ICD-10-CM

## 2023-08-21 NOTE — Progress Notes (Unsigned)
Chief Complaint: Left knee pain and swelling     History of Present Illness:   08/21/23:    08/04/23: Johnny Payne is a 27 y.o. male presenting today with left knee pain and swelling.  I did see him in clinic last week for atraumatic pain and swelling of the right knee.  This was aspirated and sent for synovial fluid analysis.  Results showed elevated nucleated cell count of 47,180 but with 43% neutrophils and no growth on culture.  His right knee is feeling mostly better, however his left knee started bothering him about 3 days ago and became much more painful yesterday.  Pain started on the lateral side of the knee but is now diffuse with significant swelling.  He is having difficulty weightbearing.  He did finish course of prednisone yesterday and has not been taking any other medications.  Denies any fever, chills, or fatigue.   Surgical History:   None  PMH/PSH/Family History/Social History/Meds/Allergies:    Past Medical History:  Diagnosis Date   Eczema    History reviewed. No pertinent surgical history. Social History   Socioeconomic History   Marital status: Single    Spouse name: Not on file   Number of children: Not on file   Years of education: Not on file   Highest education level: Not on file  Occupational History   Not on file  Tobacco Use   Smoking status: Never   Smokeless tobacco: Not on file  Substance and Sexual Activity   Alcohol use: No   Drug use: No   Sexual activity: Never  Other Topics Concern   Not on file  Social History Narrative   Not on file   Social Determinants of Health   Financial Resource Strain: Not on file  Food Insecurity: Not on file  Transportation Needs: Not on file  Physical Activity: Not on file  Stress: Not on file  Social Connections: Unknown (03/03/2022)   Received from [facility], [facility]   Social Network    Social Network: Not on file   Family History  Problem  Relation Age of Onset   Asthma Maternal Uncle    Cancer Maternal Grandfather    Allergies  Allergen Reactions   Vancomycin Swelling    Face, mouth and throat   Current Outpatient Medications  Medication Sig Dispense Refill   loperamide (IMODIUM A-D) 2 MG tablet Take 2 tablets (4 mg total) by mouth 4 (four) times daily as needed for diarrhea or loose stools. 30 tablet 0   ondansetron (ZOFRAN-ODT) 4 MG disintegrating tablet Take 1 tablet (4 mg total) by mouth every 8 (eight) hours as needed for nausea or vomiting. 20 tablet 0   No current facility-administered medications for this visit.   No results found.  Review of Systems:   A ROS was performed including pertinent positives and negatives as documented in the HPI.  Physical Exam :   Constitutional: NAD and appears stated age Neurological: Alert and oriented Psych: Appropriate affect and cooperative There were no vitals taken for this visit.   Comprehensive Musculoskeletal Exam:      Left knee appears swollen with a moderate to large effusion present.  Slight warmth to palpation but no erythema.  Active range of motion from 10 to 90 degrees.  Tenderness over the lateral knee  and lateral joint line.  Distal neurosensory exam intact.  Imaging:     Assessment:   27 y.o. male   with atraumatic pain left knee.  There is a significant effusion present and I do believe he would benefit from an aspiration.  His right knee was aspirated last week and has been doing better.  I was able to aspirate 80 mL of cloudy fluid which I will plan to send for synovial analysis including cell counts and crystals.  He does have a history of gout but fluid taken from the right knee was negative for crystals.  Will follow-up with results and he can return as needed.  Plan :    -Cortisone injections performed of bilateral knees today after patient consent -Referral to rheumatology for further evaluation     Procedure Note  Patient: Johnny Payne             Date of Birth: Jun 29, 1996           MRN: [id number]             Visit Date: 08/21/2023  Procedures: Visit Diagnoses:  1. Acute pain of right knee   2. Acute pain of left knee   3. Personal history of gout     Large Joint Inj: bilateral knee on 08/21/2023 6:12 PM Indications: pain Details: 22 G 1.5 in needle, anterolateral approach Medications (Right): 4 mL lidocaine 1 %; 2 mL triamcinolone acetonide 40 MG/ML Medications (Left): 4 mL lidocaine 1 %; 2 mL triamcinolone acetonide 40 MG/ML Outcome: tolerated well, no immediate complications Procedure, treatment alternatives, risks and benefits explained, specific risks discussed. Consent was given by the patient. Immediately prior to procedure a time out was called to verify the correct patient, procedure, equipment, support staff and site/side marked as required. Patient was prepped and draped in the usual sterile fashion.      I personally saw and evaluated the patient, and participated in the management and treatment plan.  Hazle Nordmann, PA-C Orthopedics

## 2023-08-23 MED ORDER — TRIAMCINOLONE ACETONIDE 40 MG/ML IJ SUSP
2.0000 mL | INTRAMUSCULAR | Status: AC | PRN
Start: 2023-08-21 — End: 2023-08-21
  Administered 2023-08-21: 2 mL via INTRA_ARTICULAR

## 2023-08-23 MED ORDER — LIDOCAINE HCL 1 % IJ SOLN
4.0000 mL | INTRAMUSCULAR | Status: AC | PRN
Start: 2023-08-21 — End: 2023-08-21
  Administered 2023-08-21: 4 mL

## 2023-09-14 NOTE — Progress Notes (Unsigned)
Office Visit Note  Patient: Johnny Payne             Date of Birth: 1996/04/27           MRN: [id number]             PCP: Patient, No Pcp Per Referring: Amador Cunas, PA* Visit Date: 09/28/2023 Occupation: @GUAROCC @  Subjective:  Pain in both knees   History of Present Illness: Johnny Payne is a 27 y.o. male who presents today for a new patient consultation.  Patient reports that in March 2023 he experienced severe pain and swelling involving the right knee.  He was evaluated in the emergency department on 12/28/2021 and had a right knee joint aspiration which confirmed gout crystals.  He was treated with oral prednisone.  He also followed up outpatient with orthopedics on 01/01/2022 at which time he had a right knee joint cortisone injection which resolved his symptoms.  He has not had a recurrence of a gout flare since then. Patient states that on 07/18/2023 he was evaluated the emergency department for abdominal pain and nausea.  He was diagnosed with viral gastroenteritis and was treated with Imodium and nausea.  Patient states that he developed constipation while taking Imodium and was reevaluated emergency department on 07/22/23.  Patient states that he gradually returned to a normal bland diet as tolerated.  Patient states that on 07/26/2023 he was reevaluated emergency department due to a recurrence of pain and swelling involving his right knee.  He was treated with a 5-day course of prednisone and ibuprofen due to a suspected gout flare.  He was evaluated by orthopedics on 07/29/2023, 08/04/2023, and 08/21/2023.  He had a right knee joint aspiration on 07/29/2023 which was sent for analysis--no gout crystals were noted.  He had a left knee joint effusion at his office visit on 08/04/2023 at which time 80 mL of fluid was aspirated and sent for analysis as well--no growth.  Both knee joints were injected with cortisone on 08/21/2023.  He has had temporary relief with the cortisone injections  but continues to have significant discomfort involving both knees, right greater than left.  He currently rates his pain a 5 out of 10.  He has been taking ibuprofen on a daily basis up to 2-3 times daily to manage his symptoms. He denies any other joint pain or joint swelling at this time.  He has not had any SI joint discomfort.  He denies any Achilles tendinitis or plantar fasciitis.  He denies any history of uveitis.  He denies any recent rashes.  He denies any oral or nasal ulcerations and has not had any sicca symptoms. He denies any family history of any underlying rheumatologic conditions.  He has not seen a rheumatologist in the past.   Activities of Daily Living:  Patient reports morning stiffness for 1-2 hours.   Patient Reports nocturnal pain.  Difficulty dressing/grooming: Denies Difficulty climbing stairs: Reports Difficulty getting out of chair: Reports Difficulty using hands for taps, buttons, cutlery, and/or writing: Denies  Review of Systems  Constitutional:  Negative for fatigue.  HENT:  Negative for mouth sores and mouth dryness.   Eyes:  Negative for pain and dryness.  Respiratory:  Negative for shortness of breath and wheezing.   Cardiovascular:  Positive for chest pain. Negative for palpitations.  Gastrointestinal:  Negative for blood in stool, constipation and diarrhea.  Endocrine: Negative for increased urination.  Genitourinary:  Negative for involuntary urination.  Musculoskeletal:  Positive  for joint pain, gait problem, joint pain and morning stiffness. Negative for joint swelling, myalgias, muscle tenderness and myalgias.  Skin:  Negative for color change, rash and sensitivity to sunlight.  Allergic/Immunologic: Negative for susceptible to infections.  Neurological:  Positive for dizziness and headaches.  Hematological:  Negative for swollen glands.  Psychiatric/Behavioral:  Positive for depressed mood and sleep disturbance. The patient is nervous/anxious.      PMFS History:  Patient Active Problem List   Diagnosis Date Noted   Constipation 08/24/2011    Past Medical History:  Diagnosis Date   Eczema     Family History  Problem Relation Age of Onset   Healthy Mother    Other Father        blood clot   Healthy Sister    Healthy Sister    Healthy Sister    Healthy Sister    Healthy Sister    Healthy Brother    Healthy Brother    Asthma Maternal Uncle    Cancer Maternal Grandfather    History reviewed. No pertinent surgical history. Social History   Social History Narrative   Not on file   Immunization History  Administered Date(s) Administered   Influenza Split 08/25/2011   Moderna Sars-Covid-2 Vaccination 03/15/2020, 04/12/2020     Objective: Vital Signs: BP 112/70 (BP Location: Right Arm, Patient Position: Sitting, Cuff Size: Normal)   Pulse 73   Resp 15   Ht 5\' 11"  (1.803 m)   Wt 154 lb 12.8 oz (70.2 kg)   BMI 21.59 kg/m    Physical Exam Vitals and nursing note reviewed.  Constitutional:      Appearance: He is well-developed.  HENT:     Head: Normocephalic and atraumatic.  Eyes:     Conjunctiva/sclera: Conjunctivae normal.     Pupils: Pupils are equal, round, and reactive to light.  Cardiovascular:     Rate and Rhythm: Normal rate and regular rhythm.     Heart sounds: Normal heart sounds.  Pulmonary:     Effort: Pulmonary effort is normal.     Breath sounds: Normal breath sounds.  Abdominal:     General: Bowel sounds are normal.     Palpations: Abdomen is soft.  Musculoskeletal:     Cervical back: Normal range of motion and neck supple.  Skin:    General: Skin is warm and dry.     Capillary Refill: Capillary refill takes less than 2 seconds.  Neurological:     Mental Status: He is alert and oriented to person, place, and time.  Psychiatric:        Behavior: Behavior normal.      Musculoskeletal Exam: C-spine, thoracic spine, and lumbar spine good ROM.  No midline spinal tenderness.  No SI  joint tenderness.  Shoulder joints, elbow joints, wrist joints, MCPs, PIPs, and DIPs good ROM with no synovitis.  Complete fist formation bilaterally.  Hip joints have good ROM with no groin pain.  Warmth and small effusion noted in the right knee.  Painful ROM of the right knee noted.  Mild inflammation noted in left knee.  Ankle joints have good ROM with no tenderness or joint swelling. No evidence of achilles tendonitis or plantar fasciitis.    CDAI Exam: CDAI Score: -- Patient Global: --; Provider Global: -- Swollen: --; Tender: -- Joint Exam 09/28/2023   No joint exam has been documented for this visit   There is currently no information documented on the homunculus. Go to the Rheumatology activity and  complete the homunculus joint exam.  Investigation: No additional findings.  Imaging: No results found.  Recent Labs: Lab Results  Component Value Date   WBC 11.3 (H) 07/26/2023   HGB 14.8 07/26/2023   PLT 365 07/26/2023   NA 136 07/22/2023   K 3.2 (L) 07/22/2023   CL 97 (L) 07/22/2023   CO2 22 07/22/2023   GLUCOSE 69 (L) 07/22/2023   BUN 9 07/22/2023   CREATININE 0.97 07/22/2023   BILITOT 1.2 07/22/2023   ALKPHOS 59 07/22/2023   AST 12 (L) 07/22/2023   ALT 12 07/22/2023   PROT 7.5 07/22/2023   ALBUMIN 3.6 07/22/2023   CALCIUM 8.7 (L) 07/22/2023   GFRAA NOT CALCULATED 08/23/2011    Speciality Comments: No specialty comments available.  Procedures:  No procedures performed Allergies: Vancomycin   Assessment / Plan:     Visit Diagnoses: Inflammatory arthritis: History of recurrent knee effusions-right knee most severe and frequent.  Synovial analysis positive for urate crystals from 12/28/2021 from the right knee.  Patient has not taken a urate lowering medication in the past.  Responsive to cortisone and prednisone in the past.   Concern for reactive arthritis given recent history of viral gastroenteritis followed by a recurrence of bilateral knee joint  effusions. Evaluated in ED on 07/18/2023 and 07/22/23 for viral gastroenteritis.  Had a recurrence of right knee joint pain and swelling and was evaluated the emergency department on 07/26/2023.  Right knee joint aspiration performed on 07/29/2023 and a left knee joint aspiration was performed on 08/04/2023.  Bilateral knee joint cortisone injections were performed on 08/21/2023.  Of note the recent synovial analysis was negative for crystals.  Plan to obtain the following lab work today for further evaluation.  Plan to obtain baseline immunosuppressive labs.  Can consider possible use of sulfasalazine.  G6PD was ordered.  Results will be discussed in detail at his new patient follow-up visit and treatment plan will be discussed at that time.   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  No uric acid level on file.   07/26/23: CRP 10.5 and ESR 37.  He has not taken a urate lowering medication in the past.  He had a recurrence of right knee joint swelling and was evaluated on 07/26/2023 in the ED.  He had a right knee joint aspiration performed on 07/29/2023 at [facility] and synovial analysis was negative for crystals and growth--47,180 white blood cells present.  The left knee joint was aspirated on 08/04/2023--negative for growth and crystals.   Both knee joints were injected with cortisone on 08/21/2023. Discussed the concern for reactive arthritis given recent viral gastroenteritis--evaluate on 07/18/2023 in 07/22/23 in the emergency department.   Plan to obtain the following lab work today for further evaluation.- Plan: Uric acid  High risk medication use -Plan to obtain the following baseline immunosuppressive labs.  Can consider initiating sulfasalazine pending lab results.  Results will be discussed in detail at the new patient follow-up visit.  Plan: QuantiFERON-TB Gold Plus, Serum protein electrophoresis with reflex, IgG, IgA,  IgM, Glucose 6 phosphate dehydrogenase, Hepatitis B core antibody, IgM, Hepatitis B surface antigen, Hepatitis C antibody, CBC with Differential/Platelet, Comprehensive metabolic panel  Acute pain of right knee -Initial episode of right knee joint pain and swelling in March 2023--evaluated in the emergency department-synovial analysis positive for intracellular monosodium urate crystals and 8,295 WBCs.  Patient was treated with prednisone and did not have a recurrence of symptoms.  He has not taken a urate lowering medication. X-rays of the right knee were updated on 07/26/2023 which did not reveal any evidence of arthropathy or bone abnormality.  No erosive changes noted.  Small knee joint effusion noted. Patient had a recurrence of right knee joint pain prompting evaluation in the emergency department on 07/26/2023--treated with oral prednisone.  He [facility] on 07/[facility]/2023 at which time he had a right knee joint aspiration.  Synovial fluid revealed 47,180 white blood cells, negative crystals, and no growth.  A right knee joint cortisone injection was performed on 08/21/2023.  He has had a recurrence of pain and swelling involving the right knee.  He currently rates his pain a 5 out of 10.  He has difficulty ambulating at times as well as experiences nocturnal pain intermittently.  Plan to obtain the following lab work for further evaluation.  He was given a prednisone taper starting at 20 mg tapering by 5 mg every 4 days.  plan: QuantiFERON-TB Gold Plus, Serum protein electrophoresis with reflex, IgG, IgA, IgM, Glucose 6 phosphate dehydrogenase, Hepatitis B core antibody, IgM, Hepatitis B surface antigen, Hepatitis C antibody, Rheumatoid factor, Angiotensin converting enzyme, HLA-B27 antigen, Uric acid, CBC with Differential/Platelet, ANA, CYCLIC CITRUL PEPTIDE ANTIBODY, IGG/IGA, Comprehensive metabolic panel  Acute pain of left knee -X-rays of the left knee on 08/04/2023 --joint  spaces maintained and a large suprapatellar joint effusion noted.   Left knee joint aspiration on 08/04/2023.  Left knee joint cortisone injection performed on 08/21/2023.  A prednisone taper sent to the pharmacy today to alleviate his current symptoms.  Advised the patient to avoid NSAID use while taking prednisone. The following lab work updated today for further evaluation.   plan: QuantiFERON-TB Gold Plus, Serum protein electrophoresis with reflex, IgG, IgA, IgM, Glucose 6 phosphate dehydrogenase, Hepatitis B core antibody, IgM, Hepatitis B surface antigen, Hepatitis C antibody, Rheumatoid factor, Angiotensin converting enzyme, HLA-B27 antigen, Uric acid, CBC with Differential/Platelet, ANA, CYCLIC CITRUL PEPTIDE ANTIBODY, IGG/IGA, Comprehensive metabolic panel  Elevated C-reactive protein (CRP) - CRP 10.5 and ESR 37 on 07/26/23.  The following lab work will be obtained today for further evaluation.  Plan: QuantiFERON-TB Gold Plus, Serum protein electrophoresis with reflex, IgG, IgA, IgM, Glucose 6 phosphate dehydrogenase, Hepatitis B core antibody, IgM, Hepatitis B surface antigen, Hepatitis C antibody, Rheumatoid factor, Angiotensin converting enzyme, HLA-B27 antigen, Uric acid, CBC with Differential/Platelet, ANA  Elevated sed rate - CRP 10.5 and ESR 37 on 07/26/23. Recurrent knee effusions.  The following lab work will be obtained today for further evaluation.  Plan: QuantiFERON-TB Gold Plus, Serum protein electrophoresis with reflex, IgG, IgA, IgM, Glucose 6 phosphate dehydrogenase, Hepatitis B core antibody, IgM, Hepatitis B surface antigen, Hepatitis C antibody, Rheumatoid factor, Angiotensin converting enzyme, HLA-B27 antigen, Uric acid, CBC with Differential/Platelet, ANA    Orders: Orders Placed This Encounter  Procedures   QuantiFERON-TB Gold Plus   Serum protein electrophoresis with reflex   IgG, IgA, IgM   Glucose 6 phosphate dehydrogenase   Hepatitis B core antibody, IgM   Hepatitis  B surface antigen   Hepatitis C antibody   Rheumatoid factor   Angiotensin converting enzyme   HLA-B27 antigen   Uric acid   CBC with Differential/Platelet   ANA   CYCLIC CITRUL PEPTIDE ANTIBODY, IGG/IGA   Comprehensive metabolic panel   Meds ordered this encounter  Medications   predniSONE (DELTASONE) 5 MG tablet    Sig: Take 4 tabs po qd x 4 days, 3  tabs po qd x 4 days, 2  tabs po qd x 4 days, 1  tab po qd x 4 days    Dispense:  40 tablet    Refill:  0      Follow-Up Instructions: Return for NPFU, Gout.   Gearldine Bienenstock, PA-C  Note - This record has been created using Dragon software.  Chart creation errors have been sought, but may not always  have been located. Such creation errors do not reflect on  the standard of medical care.

## 2023-09-28 ENCOUNTER — Ambulatory Visit: Payer: Self-pay | Attending: Physician Assistant | Admitting: Physician Assistant

## 2023-09-28 ENCOUNTER — Encounter: Payer: Self-pay | Admitting: Physician Assistant

## 2023-09-28 VITALS — BP 112/70 | HR 73 | Resp 15 | Ht 71.0 in | Wt 154.8 lb

## 2023-09-28 DIAGNOSIS — R700 Elevated erythrocyte sedimentation rate: Secondary | ICD-10-CM

## 2023-09-28 DIAGNOSIS — M1990 Unspecified osteoarthritis, unspecified site: Secondary | ICD-10-CM

## 2023-09-28 DIAGNOSIS — M25561 Pain in right knee: Secondary | ICD-10-CM

## 2023-09-28 DIAGNOSIS — Z79899 Other long term (current) drug therapy: Secondary | ICD-10-CM

## 2023-09-28 DIAGNOSIS — Z8739 Personal history of other diseases of the musculoskeletal system and connective tissue: Secondary | ICD-10-CM

## 2023-09-28 DIAGNOSIS — M25562 Pain in left knee: Secondary | ICD-10-CM

## 2023-09-28 DIAGNOSIS — R7982 Elevated C-reactive protein (CRP): Secondary | ICD-10-CM

## 2023-09-28 MED ORDER — PREDNISONE 5 MG PO TABS: ORAL_TABLET | ORAL | 0 refills | Status: DC

## 2023-09-29 ENCOUNTER — Other Ambulatory Visit (HOSPITAL_COMMUNITY)
Admission: AD | Admit: 2023-09-29 | Discharge: 2023-09-29 | Disposition: A | Discharge status: Home / Self Care | Payer: Self-pay | Source: Ambulatory Visit | Attending: Physician Assistant | Admitting: Physician Assistant

## 2023-09-29 DIAGNOSIS — R700 Elevated erythrocyte sedimentation rate: Secondary | ICD-10-CM | POA: Insufficient documentation

## 2023-09-29 DIAGNOSIS — M25562 Pain in left knee: Secondary | ICD-10-CM | POA: Insufficient documentation

## 2023-09-29 DIAGNOSIS — M25561 Pain in right knee: Secondary | ICD-10-CM | POA: Insufficient documentation

## 2023-09-29 DIAGNOSIS — R7982 Elevated C-reactive protein (CRP): Secondary | ICD-10-CM | POA: Insufficient documentation

## 2023-09-29 DIAGNOSIS — Z79899 Other long term (current) drug therapy: Secondary | ICD-10-CM | POA: Insufficient documentation

## 2023-09-29 DIAGNOSIS — Z8739 Personal history of other diseases of the musculoskeletal system and connective tissue: Secondary | ICD-10-CM | POA: Insufficient documentation

## 2023-09-29 LAB — CBC WITH DIFFERENTIAL/PLATELET
Abs Immature Granulocytes: 0.02 10*3/uL (ref 0.00–0.07)
Basophils Absolute: 0 10*3/uL (ref 0.0–0.1)
Basophils Relative: 1 %
Eosinophils Absolute: 0.1 10*3/uL (ref 0.0–0.5)
Eosinophils Relative: 1 %
HCT: 50.5 % (ref 39.0–52.0)
Hemoglobin: 16.6 g/dL (ref 13.0–17.0)
Immature Granulocytes: 0 %
Lymphocytes Relative: 29 %
Lymphs Abs: 1.8 10*3/uL (ref 0.7–4.0)
MCH: 28.2 pg (ref 26.0–34.0)
MCHC: 32.9 g/dL (ref 30.0–36.0)
MCV: 85.7 fL (ref 80.0–100.0)
Monocytes Absolute: 0.4 10*3/uL (ref 0.1–1.0)
Monocytes Relative: 6 %
Neutro Abs: 4 10*3/uL (ref 1.7–7.7)
Neutrophils Relative %: 63 %
Platelets: 321 10*3/uL (ref 150–400)
RBC: 5.89 MIL/uL — ABNORMAL HIGH (ref 4.22–5.81)
RDW: 12.7 % (ref 11.5–15.5)
WBC: 6.2 10*3/uL (ref 4.0–10.5)
nRBC: 0 % (ref 0.0–0.2)

## 2023-09-29 LAB — COMPREHENSIVE METABOLIC PANEL
ALT: 11 U/L (ref 0–44)
AST: 14 U/L — ABNORMAL LOW (ref 15–41)
Albumin: 4.7 g/dL (ref 3.5–5.0)
Alkaline Phosphatase: 94 U/L (ref 38–126)
Anion gap: 9 (ref 5–15)
BUN: 6 mg/dL (ref 6–20)
CO2: 26 mmol/L (ref 22–32)
Calcium: 9.8 mg/dL (ref 8.9–10.3)
Chloride: 103 mmol/L (ref 98–111)
Creatinine, Ser: 0.91 mg/dL (ref 0.61–1.24)
GFR, Estimated: >60 mL/min (ref >60)
Glucose, Bld: 84 mg/dL (ref 70–99)
Potassium: 3.8 mmol/L (ref 3.5–5.1)
Sodium: 138 mmol/L (ref 135–145)
Total Bilirubin: 0.7 mg/dL (ref <1.2)
Total Protein: 8.1 g/dL (ref 6.5–8.1)

## 2023-09-29 LAB — URIC ACID: Uric Acid, Serum: 5.3 mg/dL (ref 3.7–8.6)

## 2023-09-29 LAB — HEPATITIS B CORE ANTIBODY, IGM: Hep B C IgM: NONREACTIVE

## 2023-09-29 LAB — HEPATITIS C ANTIBODY: HCV Ab: NONREACTIVE

## 2023-09-29 LAB — HEPATITIS B SURFACE ANTIGEN: Hepatitis B Surface Ag: NONREACTIVE

## 2023-09-30 LAB — GLUCOSE 6 PHOSPHATE DEHYDROGENASE
G6PDH: 9.5 U/g{Hb} (ref 7.2–14.2)
Hemoglobin: 16.9 g/dL (ref 13.0–17.7)

## 2023-09-30 LAB — IGG, IGA, IGM
IgA: 382 mg/dL (ref 90–386)
IgG (Immunoglobin G), Serum: 1246 mg/dL (ref 603–1613)
IgM (Immunoglobulin M), Srm: 136 mg/dL (ref 20–172)

## 2023-09-30 LAB — ANA: Anti Nuclear Antibody (ANA): NEGATIVE

## 2023-09-30 LAB — RHEUMATOID FACTOR: Rheumatoid fact SerPl-aCnc: <10 [IU]/mL (ref <14.0)

## 2023-09-30 LAB — ANGIOTENSIN CONVERTING ENZYME: Angiotensin-Converting Enzyme: 28 U/L (ref 14–82)

## 2023-09-30 LAB — CYCLIC CITRUL PEPTIDE ANTIBODY, IGG/IGA: CCP Antibodies IgG/IgA: 8 U (ref 0–19)

## 2023-09-30 NOTE — Progress Notes (Signed)
Results will be discussed at NPFU

## 2023-10-01 LAB — QUANTIFERON-TB GOLD PLUS (RQFGPL)
QuantiFERON Mitogen Value: >10 [IU]/mL
QuantiFERON Nil Value: 0.18 [IU]/mL
QuantiFERON TB1 Ag Value: 0.23 [IU]/mL
QuantiFERON TB2 Ag Value: 0.18 [IU]/mL

## 2023-10-01 LAB — QUANTIFERON-TB GOLD PLUS: QuantiFERON-TB Gold Plus: NEGATIVE

## 2023-10-02 LAB — PROTEIN ELECTROPHORESIS, SERUM, WITH REFLEX
A/G Ratio: 1.3 (ref 0.7–1.7)
Albumin ELP: 4.3 g/dL (ref 2.9–4.4)
Alpha-1-Globulin: 0.2 g/dL (ref 0.0–0.4)
Alpha-2-Globulin: 0.7 g/dL (ref 0.4–1.0)
Beta Globulin: 1.2 g/dL (ref 0.7–1.3)
Gamma Globulin: 1.3 g/dL (ref 0.4–1.8)
Globulin, Total: 3.4 g/dL (ref 2.2–3.9)
Total Protein ELP: 7.7 g/dL (ref 6.0–8.5)

## 2023-10-05 LAB — HLA-B27 ANTIGEN: HLA-B27: POSITIVE

## 2023-10-06 ENCOUNTER — Telehealth: Payer: Self-pay

## 2023-10-06 ENCOUNTER — Other Ambulatory Visit: Payer: Self-pay

## 2023-10-06 ENCOUNTER — Encounter: Payer: Self-pay | Admitting: Physician Assistant

## 2023-10-06 ENCOUNTER — Ambulatory Visit: Payer: Self-pay | Attending: Physician Assistant | Admitting: Physician Assistant

## 2023-10-06 VITALS — BP 127/76 | HR 88 | Resp 15 | Ht 71.0 in | Wt 154.4 lb

## 2023-10-06 DIAGNOSIS — M25562 Pain in left knee: Secondary | ICD-10-CM

## 2023-10-06 DIAGNOSIS — R700 Elevated erythrocyte sedimentation rate: Secondary | ICD-10-CM

## 2023-10-06 DIAGNOSIS — Z79899 Other long term (current) drug therapy: Secondary | ICD-10-CM

## 2023-10-06 DIAGNOSIS — M25461 Effusion, right knee: Secondary | ICD-10-CM

## 2023-10-06 DIAGNOSIS — G8929 Other chronic pain: Secondary | ICD-10-CM

## 2023-10-06 DIAGNOSIS — M25561 Pain in right knee: Secondary | ICD-10-CM

## 2023-10-06 DIAGNOSIS — R7982 Elevated C-reactive protein (CRP): Secondary | ICD-10-CM

## 2023-10-06 DIAGNOSIS — M1280 Other specific arthropathies, not elsewhere classified, unspecified site: Secondary | ICD-10-CM

## 2023-10-06 MED ORDER — SULFASALAZINE 500 MG PO TABS: 1000.0000 mg | ORAL_TABLET | Freq: Two times a day (BID) | ORAL | 2 refills | Status: DC

## 2023-10-06 NOTE — Progress Notes (Signed)
Office Visit Note  Patient: Johnny Payne             Date of Birth: Oct 10, 1996           MRN: [id number]             PCP: Patient, No Pcp Per Referring: No ref. provider found Visit Date: 10/06/2023 Occupation: @GUAROCC @  Subjective:  Discuss results and treatment options   History of Present Illness: Johnny Payne is a 27 y.o. male who presents today to discuss lab results and treatment options.   Patient continues to have persistent pain and inflammation involving his right knee joint.  He has difficulty ambulating at times due to severity of discomfort.  He has had difficulty finding work due to the severity of pain.  Patient states that he took a few days of prednisone but discontinued and switch back to ibuprofen which she has found to be more effective.  He is open to discuss treatment options today.    Activities of Daily Living:  Patient reports morning stiffness for 1 hour.   Patient Reports nocturnal pain.  Difficulty dressing/grooming: Denies Difficulty climbing stairs: Reports Difficulty getting out of chair: Reports Difficulty using hands for taps, buttons, cutlery, and/or writing: Reports  Review of Systems  Constitutional:  Negative for fatigue.  HENT:  Negative for mouth sores, mouth dryness and nose dryness.   Eyes:  Negative for pain and dryness.  Respiratory:  Negative for cough, shortness of breath and wheezing.   Cardiovascular:  Negative for chest pain and palpitations.  Gastrointestinal:  Negative for blood in stool, constipation and diarrhea.  Endocrine: Negative for increased urination.  Genitourinary:  Negative for involuntary urination.  Musculoskeletal:  Positive for joint pain, gait problem, joint pain, muscle weakness and morning stiffness. Negative for joint swelling, myalgias, muscle tenderness and myalgias.  Skin:  Negative for color change, rash, hair loss and sensitivity to sunlight.  Allergic/Immunologic: Negative for susceptible to  infections.  Neurological:  Positive for dizziness and headaches.  Hematological:  Negative for swollen glands.  Psychiatric/Behavioral:  Positive for depressed mood. Negative for sleep disturbance. The patient is nervous/anxious.     PMFS History:  Patient Active Problem List   Diagnosis Date Noted   Constipation 08/24/2011    Past Medical History:  Diagnosis Date   Eczema     Family History  Problem Relation Age of Onset   Healthy Mother    Other Father        blood clot   Healthy Sister    Healthy Sister    Healthy Sister    Healthy Sister    Healthy Sister    Healthy Brother    Healthy Brother    Asthma Maternal Uncle    Cancer Maternal Grandfather    History reviewed. No pertinent surgical history. Social History   Social History Narrative   Not on file   Immunization History  Administered Date(s) Administered   Influenza Split 08/25/2011   Moderna Sars-Covid-2 Vaccination 03/15/2020, 04/12/2020     Objective: Vital Signs: BP 127/76 (BP Location: Left Arm, Patient Position: Sitting, Cuff Size: Normal)   Pulse 88   Resp 15   Ht 5\' 11"  (1.803 m)   Wt 154 lb 6.4 oz (70 kg)   BMI 21.53 kg/m    Physical Exam Vitals and nursing note reviewed.  Constitutional:      Appearance: He is well-developed.  HENT:     Head: Normocephalic and atraumatic.  Eyes:  Conjunctiva/sclera: Conjunctivae normal.     Pupils: Pupils are equal, round, and reactive to light.  Cardiovascular:     Rate and Rhythm: Normal rate and regular rhythm.     Heart sounds: Normal heart sounds.  Pulmonary:     Effort: Pulmonary effort is normal.     Breath sounds: Normal breath sounds.  Abdominal:     General: Bowel sounds are normal.     Palpations: Abdomen is soft.  Musculoskeletal:     Cervical back: Normal range of motion and neck supple.  Skin:    General: Skin is warm and dry.     Capillary Refill: Capillary refill takes less than 2 seconds.  Neurological:     Mental  Status: He is alert and oriented to person, place, and time.  Psychiatric:        Behavior: Behavior normal.      Musculoskeletal Exam: C-spine has good range of motion with no discomfort.  No midline spinal tenderness.  Shoulder joints, elbow joints, wrist joints, MCPs, PIPs, DIPs have good range of motion with no synovitis.  Painful and limited extension of the right knee with warmth and a small effusion.  Left knee has good range of motion with no warmth or effusion.  Ankle joints have good range of motion with no tenderness or joint swelling.no evidence of achilles tendonitis or plantar fasciitis.   CDAI Exam: CDAI Score: -- Patient Global: --; Provider Global: -- Swollen: --; Tender: -- Joint Exam 10/06/2023   No joint exam has been documented for this visit   There is currently no information documented on the homunculus. Go to the Rheumatology activity and complete the homunculus joint exam.  Investigation: No additional findings.  Imaging: No results found.  Recent Labs: Lab Results  Component Value Date   WBC 6.2 09/29/2023   HGB 16.9 09/29/2023   HGB 16.6 09/29/2023   PLT 321 09/29/2023   NA 138 09/29/2023   K 3.8 09/29/2023   CL 103 09/29/2023   CO2 26 09/29/2023   GLUCOSE 84 09/29/2023   BUN 6 09/29/2023   CREATININE 0.91 09/29/2023   BILITOT 0.7 09/29/2023   ALKPHOS 94 09/29/2023   AST 14 (L) 09/29/2023   ALT 11 09/29/2023   PROT 8.1 09/29/2023   ALBUMIN 4.7 09/29/2023   CALCIUM 9.8 09/29/2023   GFRAA NOT CALCULATED 08/23/2011   QFTBGOLDPLUS Negative 09/29/2023    Speciality Comments: No specialty comments available.  Procedures:  No procedures performed Allergies: Vancomycin   Assessment / Plan:     Visit Diagnoses: HLA-B27 positive arthropathy: HLA-B27+, elevated sed rate, elevated CRP, recurrent knee effusions, right worse than left, high suspicion for reactive arthritis following gastroenteritis-end of September 2024, RF-, Anti-CCP-: Patient  presents today to discuss lab results and treatment options.  HLA-B27 was positive on 09/29/2023--discussed significance and likely diagnosis of reactive arthritis.  He continues to have significant pain and inflammation involving his right knee.  He has been taking ibuprofen 400 mg 2 times daily for symptomatic relief.  The pain in his right knee remains a 7 out of 10.  Warmth and a small effusion was noted.  Limited extension of the right knee was noted.  Patient was prescribed a prednisone taper after his initial office visit but he only took it for a few days and switch back to ibuprofen which she has found to be more effective.  Different treatment options were discussed today including sulfasalazine.  G6PD was within normal limits on 09/29/2023.  Baseline  immunosuppressive labs were also obtained.  Indications, contraindications, potential side effects of sulfasalazine reviewed today in detail.  All questions were addressed and consent was obtained.  Plan to initiate sulfasalazine 500 mg 2 tablets by mouth twice daily.  A prescription for sulfasalazine will be sent to the pharmacy today.  He will notify us if he cannot tolerate taking sulfasalazine.  He will require updated lab work in 1 month and every 3 months.  Standing orders for CBC and CMP were placed today.  He will follow-up in the office in 6 weeks to assess his response.  Medication counseling:  Baseline Immunosuppressant Labs    Latest Ref Rng & Units 09/29/2023    4:25 PM  Quantiferon TB Gold  Quantiferon TB Gold Plus Negative Negative    Hepatitis Panel    Latest Ref Rng & Units 09/29/2023    4:25 PM  Hepatitis  Hep B Surface Ag NON REACTIVE NON REACTIVE   Hep B IgM NON REACTIVE NON REACTIVE   Hep C Ab NON REACTIVE NON REACTIVE        Latest Ref Rng & Units 09/29/2023    4:25 PM  Immunoglobulin Electrophoresis  IgG 603 - 1,613 mg/dL 6,269   IgM 20 - 485 mg/dL 462    SPEP    Latest Ref Rng & Units 09/29/2023    4:25 PM   Serum Protein Electrophoresis  Total Protein 6.5 - 8.1 g/dL 8.1   Albumin 2.9 - 4.4 g/dL 4.3   Alpha-1 0.0 - 0.4 g/dL 0.2   Alpha-2 0.4 - 1.0 g/dL 0.7   Beta Globulin 0.7 - 1.3 g/dL 1.2   Gamma Globulin 0.4 - 1.8 g/dL 1.3    V0JJ Lab Results  Component Value Date   G6PDH 9.5 09/29/2023     Chest x-ray: CXR 08/23/11 and 07/28/12   Does the patient have an allergy to sulfa drugs? No  Patient was counseled on the purpose, proper use, and adverse effects of sulfasalazine including risk of infection and chance of nausea, headache, and sun sensitivity.  Also discussed risk of skin rash and advised patient to stop the medication and let us know if she develops a rash. Also discussed for the potential of discoloration of the urine, sweat, or tears.  Advised patient to avoid live vaccines.  Recommend annual influenza, Pneumovax 23, Prevnar 13, and Shingrix as indicated.   Reviewed the importance of frequent labs to monitor liver, kidneys, and blood counts.  Standing orders placed and patient to return 1 month after starting therapy and then every 3 months.  Provided patient with educational materials on sulfasalazine and answered all questions.  Patient consented to sulfasalazine use, and consent will be uploaded into the media tab.    Patient dose will be 500 mg 2 tablets by mouth twice daily.  Prescription will be sent to pharmacy pending lab results and insurance approval.  High risk medication use: Sulfasalazine 500 mg 2 tablets by mouth twice daily.   Patient does not currently have insurance.  Self-pay.  He is going to look into acquiring medicaid to cover frequent office visits and lab work. 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  G6PDH WNL on 09/29/23.  CBC and CMP stable on 09/29/23.  Consent obtained today to initiate sulfasalazine.  Plan to update lab work in 1 month and every 3 months.  Standing orders for CBC and CMP replaced  today.  Chronic pain of  right knee: Patient presents today with ongoing pain and inflammation involving the right knee.  Warmth and a small effusion in the right knee noted.  Patient had a right knee joint cortisone injection on 08/21/2023.  A prednisone taper was prescribed at his last office visit on 09/28/2023 which she took for several days but discontinued.  Patient has found ibuprofen to be more effective than taking prednisone.  Plan to initiate sulfasalazine as discussed above.  Effusion of right knee: X-rays of the right knee were updated on 07/26/2023 which did not reveal any evidence of arthropathy or bone abnormality.  No erosive changes noted.  Small knee joint effusion noted. Patient had a recurrence of right knee joint pain prompting evaluation in the emergency department on 07/26/2023--treated with oral prednisone.  [facility] at which time he had a right knee joint aspiration.  Synovial fluid revealed 47,180 white blood cells, negative crystals, and no growth.  A right knee joint cortisone injection was performed on 08/21/2023.  Lab work from 09/29/23 was reviewed today in the office: Uric acid 5.3, HLA-B27 positive, ACE within normal limits, RF negative, anti-CCP negative, ANA negative.   Discussed the  Warmth and a small effusion of the right knee noted.  Plan to initiate sulfasalazine as discussed above.  Chronic pain of left knee: X-rays of the left knee on 08/04/2023 --joint spaces maintained and a large suprapatellar joint effusion noted.   Left knee joint aspiration on 08/04/2023.  Left knee joint cortisone injection performed on 08/21/2023.  No warmth or effusion noted today.   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  No previous uric acid level on file.  He has not taken a urate lowering medication in the past.  He had a recurrence of right knee joint  swelling and was evaluated on 07/26/2023 in the ED.  He had a right knee joint aspiration performed on 07/29/2023 at [facility] and synovial analysis was negative for crystals and growth--47,180 white blood cells present.  The left knee joint was aspirated on 08/04/2023--negative for growth and crystals.   Both knee joints were injected with cortisone on 08/21/2023. Discussed the concern for reactive arthritis given recent viral Uric acid was 5.3 on 09/29/2023.  Elevated C-reactive protein (CRP): CRP 10.5 on 07/26/23. Plan to initiate sulfasalazine as discussed above.   Elevated sed rate: ESR 37 on 07/26/23.   Orders: Orders Placed This Encounter  Procedures   COMPLETE METABOLIC PANEL WITH GFR   CBC with Differential/Platelet   No orders of the defined types were placed in this encounter.    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   Gearldine Bienenstock, PA-C  Note - This record has been created using Dragon software.  Chart creation errors have been sought, but may not always  have been located. Such creation errors do not reflect on  the standard of medical care.

## 2023-10-06 NOTE — Patient Instructions (Signed)
Standing Labs We placed an order today for your standing lab work.   Please have your standing labs drawn in 1 month then every 3 months   Please have your labs drawn 2 weeks prior to your appointment so that the provider can discuss your lab results at your appointment, if possible.  Please note that you may see your imaging and lab results in MyChart before we have reviewed them. We will contact you once all results are reviewed. Please allow our office up to 72 hours to thoroughly review all of the results before contacting the office for clarification of your results.  WALK-IN LAB HOURS  Monday through Thursday from 8:00 am -12:30 pm and 1:00 pm-5:00 pm and Friday from 8:00 am-12:00 pm.  Patients with office visits requiring labs will be seen before walk-in labs.  You may encounter longer than normal wait times. Please allow additional time. Wait times may be shorter on  Monday and Thursday afternoons.  We do not book appointments for walk-in labs. We appreciate your patience and understanding with our staff.   Labs are drawn by [facility]. Please bring your co-pay at the time of your lab draw.  You may receive a bill from [facility] for your lab work.  Please note if you are on Hydroxychloroquine and and an order has been placed for a Hydroxychloroquine level,  you will need to have it drawn 4 hours or more after your last dose.  If you wish to have your labs drawn at another location, please call the office 24 hours in advance so we can fax the orders.  The office is located at [address], [place], [place] [address]   If you have any questions regarding directions or hours of operation,  please call [phone].   As a reminder, please drink plenty of water prior to coming for your lab work. Thanks!   Sulfasalazine Tablets What is this medication? SULFASALAZINE (sul fa SAL a zeen) treats ulcerative colitis. It works by decreasing inflammation. It belongs to a group of  medications called salicylates. This medicine may be used for other purposes; ask your health care provider or pharmacist if you have questions. COMMON BRAND NAME(S): Azulfidine, Sulfazine What should I tell my care team before I take this medication? They need to know if you have any of these conditions: Asthma Blood disorders or anemia Glucose-6-phosphate dehydrogenase (G6PD) deficiency Intestinal obstruction Kidney disease Liver disease Porphyria Urinary tract obstruction An unusual reaction to sulfasalazine, sulfa medications, salicylates, or other medications, foods, dyes, or preservatives Pregnant or trying to get pregnant Breast-feeding How should I use this medication? Take this medication by mouth with a full glass of water. Take it as directed on the prescription label at the same time every day. You can take it with or without food. If it upsets your stomach, take it with food. Keep taking it unless your care team tells you to stop. Talk to your care team about the use of this medication in children. While this medication may be prescribed for children as young as 6 years for selected conditions, precautions do apply. Patients over 40 years old may have a stronger reaction and need a smaller dose. Overdosage: If you think you have taken too much of this medicine contact a poison control center or emergency room at once. NOTE: This medicine is only for you. Do not share this medicine with others. What if I miss a dose? If you miss a dose, take it as  soon as you can. If it is almost time for your next dose, take only that dose. Do not take double or extra doses. What may interact with this medication? Digoxin Folic acid This list may not describe all possible interactions. Give your health care provider a list of all the medicines, herbs, non-prescription drugs, or dietary supplements you use. Also tell them if you smoke, drink alcohol, or use illegal drugs. Some items may  interact with your medicine. What should I watch for while using this medication? Visit your care team for regular checks on your progress. Tell your care team if your symptoms do not start to get better or if they get worse. You will need frequent blood and urine checks. This medication can make you more sensitive to the sun. Keep out of the sun. If you cannot avoid being in the sun, wear protective clothing and use sunscreen. Do not use sun lamps or tanning beds/booths. Drink plenty of water while taking this medication. What side effects may I notice from receiving this medication? Side effects that you should report to your care team as soon as possible: Allergic reactions--skin rash, itching, hives, swelling of the face, lips, tongue, or throat Aplastic anemia--unusual weakness or fatigue, dizziness, headache, trouble breathing, increased bleeding or bruising Dry cough, shortness of breath or trouble breathing Heart muscle inflammation--unusual weakness or fatigue, shortness of breath, chest pain, fast or irregular heartbeat, dizziness, swelling of the ankles, feet, or hands Infection--fever, chills, cough, sore throat, wounds that don't heal, pain or trouble when passing urine, general feeling of discomfort or being unwell Kidney injury--decrease in the amount of urine, swelling of the ankles, hands, or feet Liver injury--right upper belly pain, loss of appetite, nausea, light-colored stool, dark yellow or brown urine, yellowing skin or eyes, unusual weakness or fatigue Rash, fever, and swollen lymph nodes Redness, blistering, peeling, or loosening of the skin, including inside the mouth Side effects that usually do not require medical attention (report to your care team if they continue or are bothersome): Dark yellow or orange saliva, sweat, or urine Dizziness Headache Loss of appetite Nausea Upset stomach Vomiting This list may not describe all possible side effects. Call your  doctor for medical advice about side effects. You may report side effects to FDA at 1-800-FDA-1088. Where should I keep my medication? Keep out of the reach of children and pets. Store at room temperature between 15 and 30 degrees C (59 and 86 degrees F). Get rid of any unused medication after the expiration date. To get rid of medications that are no longer needed or have expired: Take the medications to a medication take-back program. Check with your pharmacy or law enforcement to find a location. If you cannot return the medication, check the label or package insert to see if the medication should be thrown out in the garbage or flushed down the toilet. If you are not sure, ask your care team. If it is safe to put it in the trash, take the medication out of the container. Mix the medication with cat litter, dirt, coffee grounds, or other unwanted substance. Seal the mixture in a bag or container. Put it in the trash. NOTE: This sheet is a summary. It may not cover all possible information. If you have questions about this medicine, talk to your doctor, pharmacist, or health care provider.  2024 Elsevier/Gold Standard (2021-08-29 00:00:00)

## 2023-10-06 NOTE — Telephone Encounter (Signed)
Patient will be establishing care with [facility] and Wellness or possibly applying for Medicaid. He is due back for labs in 1 month. He will let us know which route (Medicaid, self pay or [employer] and [facility]) that he decides to go for labs. I advised patient that depending on what he decides, dictates where he can have them drawn as our office uses [facility] and we may have to send him over to the hospital for outpatient labs. Patient verbalized understanding and will contact the office.

## 2023-10-06 NOTE — Telephone Encounter (Signed)
Please review and sign pended rx for SSZ new start. Thanks!

## 2023-10-06 NOTE — Progress Notes (Signed)
Please schedule sooner NPFU visit to discuss results and treatment options

## 2023-11-03 NOTE — Progress Notes (Unsigned)
Office Visit Note  Patient: Johnny Payne             Date of Birth: 17-Dec-1995           MRN: [id number]             PCP: Patient, No Pcp Per Referring: No ref. provider found Visit Date: 11/17/2023 Occupation: @GUAROCC @  Subjective:  Medication monitoring   History of Present Illness: Johnny Payne is a 28 y.o. male with history of HLA-B27 spondyloarthropathy.  Patient is currently taking sulfasalazine 500 mg 2 tablets by mouth twice daily.    CBC and CMP updated on 09/29/23. Orders for CBC and CMP released today.   Activities of Daily Living:  Patient reports morning stiffness for *** {minute/hour:19697}.   Patient {ACTIONS;DENIES/REPORTS:21021675::"Denies"} nocturnal pain.  Difficulty dressing/grooming: {ACTIONS;DENIES/REPORTS:21021675::"Denies"} Difficulty climbing stairs: {ACTIONS;DENIES/REPORTS:21021675::"Denies"} Difficulty getting out of chair: {ACTIONS;DENIES/REPORTS:21021675::"Denies"} Difficulty using hands for taps, buttons, cutlery, and/or writing: {ACTIONS;DENIES/REPORTS:21021675::"Denies"}  No Rheumatology ROS completed.   PMFS History:  Patient Active Problem List   Diagnosis Date Noted   Constipation 08/24/2011    Past Medical History:  Diagnosis Date   Eczema     Family History  Problem Relation Age of Onset   Healthy Mother    Other Father        blood clot   Healthy Sister    Healthy Sister    Healthy Sister    Healthy Sister    Healthy Sister    Healthy Brother    Healthy Brother    Asthma Maternal Uncle    Cancer Maternal Grandfather    No past surgical history on file. Social History   Social History Narrative   Not on file   Immunization History  Administered Date(s) Administered   Influenza Split 08/25/2011   Moderna Sars-Covid-2 Vaccination 03/15/2020, 04/12/2020     Objective: Vital Signs: There were no vitals taken for this visit.   Physical Exam Vitals and nursing note reviewed.  Constitutional:      Appearance: He  is well-developed.  HENT:     Head: Normocephalic and atraumatic.  Eyes:     Conjunctiva/sclera: Conjunctivae normal.     Pupils: Pupils are equal, round, and reactive to light.  Cardiovascular:     Rate and Rhythm: Normal rate and regular rhythm.     Heart sounds: Normal heart sounds.  Pulmonary:     Effort: Pulmonary effort is normal.     Breath sounds: Normal breath sounds.  Abdominal:     General: Bowel sounds are normal.     Palpations: Abdomen is soft.  Musculoskeletal:     Cervical back: Normal range of motion and neck supple.  Skin:    General: Skin is warm and dry.     Capillary Refill: Capillary refill takes less than 2 seconds.  Neurological:     Mental Status: He is alert and oriented to person, place, and time.  Psychiatric:        Behavior: Behavior normal.      Musculoskeletal Exam: ***  CDAI Exam: CDAI Score: -- Patient Global: --; Provider Global: -- Swollen: --; Tender: -- Joint Exam 11/17/2023   No joint exam has been documented for this visit   There is currently no information documented on the homunculus. Go to the Rheumatology activity and complete the homunculus joint exam.  Investigation: No additional findings.  Imaging: No results found.  Recent Labs: Lab Results  Component Value Date   WBC 6.2 09/29/2023   HGB 16.9 09/29/2023  HGB 16.6 09/29/2023   PLT 321 09/29/2023   NA 138 09/29/2023   K 3.8 09/29/2023   CL 103 09/29/2023   CO2 26 09/29/2023   GLUCOSE 84 09/29/2023   BUN 6 09/29/2023   CREATININE 0.91 09/29/2023   BILITOT 0.7 09/29/2023   ALKPHOS 94 09/29/2023   AST 14 (L) 09/29/2023   ALT 11 09/29/2023   PROT 8.1 09/29/2023   ALBUMIN 4.7 09/29/2023   CALCIUM 9.8 09/29/2023   GFRAA NOT CALCULATED 08/23/2011   QFTBGOLDPLUS Negative 09/29/2023    Speciality Comments: No specialty comments available.  Procedures:  No procedures performed Allergies: Vancomycin   Assessment / Plan:     Visit Diagnoses: HLA-B27  positive arthropathy  High risk medication use  Chronic pain of right knee  Effusion of right knee  Chronic pain of left knee  Elevated C-reactive protein (CRP)  Elevated sed rate  Personal history of gout  Orders: No orders of the defined types were placed in this encounter.  No orders of the defined types were placed in this encounter.   Face-to-face time spent with patient was *** minutes. Greater than 50% of time was spent in counseling and coordination of care.  Follow-Up Instructions: No follow-ups on file.   Gearldine Bienenstock, PA-C  Note - This record has been created using Dragon software.  Chart creation errors have been sought, but may not always  have been located. Such creation errors do not reflect on  the standard of medical care.

## 2023-11-04 ENCOUNTER — Ambulatory Visit: Payer: Self-pay | Admitting: Physician Assistant

## 2023-11-17 ENCOUNTER — Ambulatory Visit: Payer: Self-pay | Attending: Physician Assistant | Admitting: Physician Assistant

## 2023-11-17 ENCOUNTER — Encounter: Payer: Self-pay | Admitting: Physician Assistant

## 2023-11-17 ENCOUNTER — Other Ambulatory Visit (HOSPITAL_COMMUNITY)
Admission: RE | Admit: 2023-11-17 | Discharge: 2023-11-17 | Disposition: A | Discharge status: Home / Self Care | Payer: Self-pay | Source: Ambulatory Visit | Attending: *Deleted | Admitting: *Deleted

## 2023-11-17 VITALS — BP 105/59 | HR 73 | Resp 14 | Ht 71.0 in | Wt 154.0 lb

## 2023-11-17 DIAGNOSIS — M25461 Effusion, right knee: Secondary | ICD-10-CM

## 2023-11-17 DIAGNOSIS — R700 Elevated erythrocyte sedimentation rate: Secondary | ICD-10-CM

## 2023-11-17 DIAGNOSIS — Z79899 Other long term (current) drug therapy: Secondary | ICD-10-CM

## 2023-11-17 DIAGNOSIS — Z8739 Personal history of other diseases of the musculoskeletal system and connective tissue: Secondary | ICD-10-CM

## 2023-11-17 DIAGNOSIS — M25562 Pain in left knee: Secondary | ICD-10-CM

## 2023-11-17 DIAGNOSIS — G8929 Other chronic pain: Secondary | ICD-10-CM

## 2023-11-17 DIAGNOSIS — M25561 Pain in right knee: Secondary | ICD-10-CM

## 2023-11-17 DIAGNOSIS — M1280 Other specific arthropathies, not elsewhere classified, unspecified site: Secondary | ICD-10-CM

## 2023-11-17 DIAGNOSIS — R7982 Elevated C-reactive protein (CRP): Secondary | ICD-10-CM

## 2023-11-17 LAB — CBC WITH DIFFERENTIAL/PLATELET
Abs Immature Granulocytes: 0 K/uL (ref 0.00–0.07)
Basophils Absolute: 0 K/uL (ref 0.0–0.1)
Basophils Relative: 0 %
Eosinophils Absolute: 0 K/uL (ref 0.0–0.5)
Eosinophils Relative: 0 %
HCT: 49 % (ref 39.0–52.0)
Hemoglobin: 16.3 g/dL (ref 13.0–17.0)
Immature Granulocytes: 0 %
Lymphocytes Relative: 37 %
Lymphs Abs: 1.8 K/uL (ref 0.7–4.0)
MCH: 27.8 pg (ref 26.0–34.0)
MCHC: 33.3 g/dL (ref 30.0–36.0)
MCV: 83.6 fL (ref 80.0–100.0)
Monocytes Absolute: 0.4 K/uL (ref 0.1–1.0)
Monocytes Relative: 8 %
Neutro Abs: 2.7 K/uL (ref 1.7–7.7)
Neutrophils Relative %: 55 %
Platelets: 238 K/uL (ref 150–400)
RBC: 5.86 MIL/uL — ABNORMAL HIGH (ref 4.22–5.81)
RDW: 13 % (ref 11.5–15.5)
WBC: 4.9 K/uL (ref 4.0–10.5)
nRBC: 0 % (ref 0.0–0.2)

## 2023-11-17 LAB — COMPREHENSIVE METABOLIC PANEL
ALT: 12 U/L (ref 0–44)
AST: 12 U/L — ABNORMAL LOW (ref 15–41)
Albumin: 4.3 g/dL (ref 3.5–5.0)
Alkaline Phosphatase: 74 U/L (ref 38–126)
Anion gap: 7 (ref 5–15)
BUN: 8 mg/dL (ref 6–20)
CO2: 27 mmol/L (ref 22–32)
Calcium: 9.4 mg/dL (ref 8.9–10.3)
Chloride: 104 mmol/L (ref 98–111)
Creatinine, Ser: 0.9 mg/dL (ref 0.61–1.24)
GFR, Estimated: >60 mL/min (ref >60)
Glucose, Bld: 77 mg/dL (ref 70–99)
Potassium: 4 mmol/L (ref 3.5–5.1)
Sodium: 138 mmol/L (ref 135–145)
Total Bilirubin: 0.8 mg/dL (ref 0.0–1.2)
Total Protein: 7.3 g/dL (ref 6.5–8.1)

## 2023-11-17 NOTE — Progress Notes (Signed)
AST is borderline low-stable. Rest of CMP WNL.  Ok to continue sulasalazine as prescribed.

## 2023-11-17 NOTE — Progress Notes (Signed)
RBC count remains borderline elevated but stable. Rest of CBC WNL.

## 2024-02-01 NOTE — Progress Notes (Deleted)
 Office Visit Note  Patient: Johnny Payne             Date of Birth: 1996/02/13           MRN: [id number]             PCP: Patient, No Pcp Per Referring: No ref. provider found Visit Date: 02/15/2024 Occupation: @GUAROCC @  Subjective:  Medication monitoring   History of Present Illness: Isay Trochez is a 28 y.o. male with history of  HLA-B27 positive arthropathy.  Patient is currently taking sulfasalazine  500 mg 2 tablets by mouth twice daily.   CBC and CMP updated on 11/17/23.  Orders for CBC and CMP released today.     Activities of Daily Living:  Patient reports morning stiffness for *** {minute/hour:19697}.   Patient {ACTIONS;DENIES/REPORTS:21021675::"Denies"} nocturnal pain.  Difficulty dressing/grooming: {ACTIONS;DENIES/REPORTS:21021675::"Denies"} Difficulty climbing stairs: {ACTIONS;DENIES/REPORTS:21021675::"Denies"} Difficulty getting out of chair: {ACTIONS;DENIES/REPORTS:21021675::"Denies"} Difficulty using hands for taps, buttons, cutlery, and/or writing: {ACTIONS;DENIES/REPORTS:21021675::"Denies"}  No Rheumatology ROS completed.   PMFS History:  Patient Active Problem List   Diagnosis Date Noted   Constipation 08/24/2011    Past Medical History:  Diagnosis Date   Eczema     Family History  Problem Relation Age of Onset   Healthy Mother    Other Father        blood clot   Healthy Sister    Healthy Sister    Healthy Sister    Healthy Sister    Healthy Sister    Healthy Brother    Healthy Brother    Asthma Maternal Uncle    Cancer Maternal Grandfather    No past surgical history on file. Social History   Social History Narrative   Not on file   Immunization History  Administered Date(s) Administered   Influenza Split 08/25/2011   Moderna Sars-Covid-2 Vaccination 03/15/2020, 04/12/2020     Objective: Vital Signs: There were no vitals taken for this visit.   Physical Exam Vitals and nursing note reviewed.  Constitutional:      Appearance:  He is well-developed.  HENT:     Head: Normocephalic and atraumatic.  Eyes:     Conjunctiva/sclera: Conjunctivae normal.     Pupils: Pupils are equal, round, and reactive to light.  Cardiovascular:     Rate and Rhythm: Normal rate and regular rhythm.     Heart sounds: Normal heart sounds.  Pulmonary:     Effort: Pulmonary effort is normal.     Breath sounds: Normal breath sounds.  Abdominal:     General: Bowel sounds are normal.     Palpations: Abdomen is soft.  Musculoskeletal:     Cervical back: Normal range of motion and neck supple.  Skin:    General: Skin is warm and dry.     Capillary Refill: Capillary refill takes less than 2 seconds.  Neurological:     Mental Status: He is alert and oriented to person, place, and time.  Psychiatric:        Behavior: Behavior normal.      Musculoskeletal Exam: ***  CDAI Exam: CDAI Score: -- Patient Global: --; Provider Global: -- Swollen: --; Tender: -- Joint Exam 02/15/2024   No joint exam has been documented for this visit   There is currently no information documented on the homunculus. Go to the Rheumatology activity and complete the homunculus joint exam.  Investigation: No additional findings.  Imaging: No results found.  Recent Labs: Lab Results  Component Value Date   WBC 4.9 11/17/2023  HGB 16.3 11/17/2023   PLT 238 11/17/2023   NA 138 11/17/2023   K 4.0 11/17/2023   CL 104 11/17/2023   CO2 27 11/17/2023   GLUCOSE 77 11/17/2023   BUN 8 11/17/2023   CREATININE 0.90 11/17/2023   BILITOT 0.8 11/17/2023   ALKPHOS 74 11/17/2023   AST 12 (L) 11/17/2023   ALT 12 11/17/2023   PROT 7.3 11/17/2023   ALBUMIN 4.3 11/17/2023   CALCIUM 9.4 11/17/2023   GFRAA NOT CALCULATED 08/23/2011   QFTBGOLDPLUS Negative 09/29/2023    Speciality Comments: No specialty comments available.  Procedures:  No procedures performed Allergies: Vancomycin   Assessment / Plan:     Visit Diagnoses: HLA-B27 positive  arthropathy  High risk medication use  Chronic pain of right knee  Effusion of right knee  Chronic pain of left knee  Elevated C-reactive protein (CRP)  Elevated sed rate  Personal history of gout  Orders: No orders of the defined types were placed in this encounter.  No orders of the defined types were placed in this encounter.   Face-to-face time spent with patient was *** minutes. Greater than 50% of time was spent in counseling and coordination of care.  Follow-Up Instructions: No follow-ups on file.   Romayne Clubs, PA-C  Note - This record has been created using Dragon software.  Chart creation errors have been sought, but may not always  have been located. Such creation errors do not reflect on  the standard of medical care.

## 2024-02-15 ENCOUNTER — Ambulatory Visit: Payer: Self-pay | Admitting: Physician Assistant

## 2024-02-15 DIAGNOSIS — R700 Elevated erythrocyte sedimentation rate: Secondary | ICD-10-CM

## 2024-02-15 DIAGNOSIS — M25461 Effusion, right knee: Secondary | ICD-10-CM

## 2024-02-15 DIAGNOSIS — M1280 Other specific arthropathies, not elsewhere classified, unspecified site: Secondary | ICD-10-CM

## 2024-02-15 DIAGNOSIS — Z79899 Other long term (current) drug therapy: Secondary | ICD-10-CM

## 2024-02-15 DIAGNOSIS — Z8739 Personal history of other diseases of the musculoskeletal system and connective tissue: Secondary | ICD-10-CM

## 2024-02-15 DIAGNOSIS — R7982 Elevated C-reactive protein (CRP): Secondary | ICD-10-CM

## 2024-02-15 DIAGNOSIS — G8929 Other chronic pain: Secondary | ICD-10-CM

## 2024-02-16 ENCOUNTER — Ambulatory Visit (HOSPITAL_COMMUNITY)
Admission: EM | Admit: 2024-02-16 | Discharge: 2024-02-16 | Disposition: A | Discharge status: Home / Self Care | Payer: Self-pay | Attending: Family Medicine | Admitting: Family Medicine

## 2024-02-16 ENCOUNTER — Encounter (HOSPITAL_COMMUNITY): Payer: Self-pay

## 2024-02-16 DIAGNOSIS — Z113 Encounter for screening for infections with a predominantly sexual mode of transmission: Secondary | ICD-10-CM

## 2024-02-16 DIAGNOSIS — R369 Urethral discharge, unspecified: Secondary | ICD-10-CM

## 2024-02-16 NOTE — ED Provider Notes (Signed)
 [facility]    CSN: [id number] Arrival date & time: 02/16/24  1153      History   Chief Complaint Chief Complaint  Patient presents with   SEXUALLY TRANSMITTED DISEASE    HPI Johnny Payne is a 28 y.o. male.   Patient is here for penile d/c x 2-3 days.  No known exposures to STD.  No fevers/chills.  He would like blood work today as well.         Past Medical History:  Diagnosis Date   Eczema     Patient Active Problem List   Diagnosis Date Noted   Constipation 08/24/2011    History reviewed. No pertinent surgical history.     Home Medications    Prior to Admission medications   Not on File    Family History Family History  Problem Relation Age of Onset   Healthy Mother    Other Father        blood clot   Healthy Sister    Healthy Sister    Healthy Sister    Healthy Sister    Healthy Sister    Healthy Brother    Healthy Brother    Asthma Maternal Uncle    Cancer Maternal Grandfather     Social History Social History   Tobacco Use   Smoking status: Never    Passive exposure: Current   Smokeless tobacco: Never  Vaping Use   Vaping status: Never Used  Substance Use Topics   Alcohol use: No   Drug use: No     Allergies   Vancomycin   Review of Systems Review of Systems  Constitutional: Negative.   HENT: Negative.    Respiratory: Negative.    Cardiovascular: Negative.   Gastrointestinal: Negative.   Genitourinary:  Positive for penile discharge.     Physical Exam Triage Vital Signs ED Triage Vitals [02/16/24 1327]  Encounter Vitals Group     BP (!) 163/79     Systolic BP Percentile      Diastolic BP Percentile      Pulse Rate 80     Resp 18     Temp 98.1 F (36.7 C)     Temp Source Oral     SpO2 96 %     Weight      Height      Head Circumference      Peak Flow      Pain Score 0     Pain Loc      Pain Education      Exclude from Growth Chart    No data found.  Updated Vital Signs BP (!)  163/79 (BP Location: Left Arm)   Pulse 80   Temp 98.1 F (36.7 C) (Oral)   Resp 18   SpO2 96%   Visual Acuity Right Eye Distance:   Left Eye Distance:   Bilateral Distance:    Right Eye Near:   Left Eye Near:    Bilateral Near:     Physical Exam Constitutional:      Appearance: Normal appearance. He is normal weight.  Cardiovascular:     Rate and Rhythm: Normal rate and regular rhythm.  Pulmonary:     Effort: Pulmonary effort is normal.     Breath sounds: Normal breath sounds.  Neurological:     General: No focal deficit present.     Mental Status: He is alert.  Psychiatric:        Mood and Affect: Mood normal.  UC Treatments / Results  Labs (all labs ordered are listed, but only abnormal results are displayed) Labs Reviewed  HIV ANTIBODY (ROUTINE TESTING W REFLEX)  RPR  CYTOLOGY, (ORAL, ANAL, URETHRAL) ANCILLARY ONLY    EKG   Radiology No results found.  Procedures Procedures (including critical care time)  Medications Ordered in UC Medications - No data to display  Initial Impression / Assessment and Plan / UC Course  I have reviewed the triage vital signs and the nursing notes.  Pertinent labs & imaging results that were available during my care of the patient were reviewed by me and considered in my medical decision making (see chart for details).   Final Clinical Impressions(s) / UC Diagnoses   Final diagnoses:  Penile discharge  Screening for STD (sexually transmitted disease)     Discharge Instructions      You were seen today for penile discharge.  Your swab and blood work should be resulted tomorrow.  If there is anything positive our nurse will notify you for treatment.  Avoid intercourse until all results are complete and treatment has been given.      ED Prescriptions   None    PDMP not reviewed this encounter.   Lesle Ras, MD 02/16/24 1349

## 2024-02-16 NOTE — ED Triage Notes (Signed)
 Pt c/o penile discharge x2-3 days. Admits to having unprotected intercourse. Denies known exposure.

## 2024-02-16 NOTE — Discharge Instructions (Signed)
 You were seen today for penile discharge.  Your swab and blood work should be resulted tomorrow.  If there is anything positive our nurse will notify you for treatment.  Avoid intercourse until all results are complete and treatment has been given.

## 2024-02-16 NOTE — ED Notes (Signed)
 Pt discharged before blood work was completed. Attempted to call pt multiple times. No answer.

## 2024-02-18 ENCOUNTER — Telehealth (HOSPITAL_COMMUNITY): Payer: Self-pay

## 2024-02-18 LAB — CYTOLOGY, (ORAL, ANAL, URETHRAL) ANCILLARY ONLY
Chlamydia: NEGATIVE
Comment: NEGATIVE
Comment: NEGATIVE
Comment: NORMAL
Neisseria Gonorrhea: POSITIVE — AB
Trichomonas: NEGATIVE

## 2024-02-18 NOTE — Telephone Encounter (Signed)
 Please notify patient of results and that he needs to come back in for a nurse visit to have an injection of ceftriaxone  to treat the gonorrhea. The test was negative for chlamydia or trichomonas.  It looks like bloodwork was not done, correct?

## 2024-02-18 NOTE — Telephone Encounter (Signed)
 Patient informed and voiced understanding. Patient has been scheduled for visit.

## 2024-02-18 NOTE — Telephone Encounter (Signed)
 Patient tested positive for Gonorrhea. Please advise

## 2024-02-19 ENCOUNTER — Ambulatory Visit (HOSPITAL_COMMUNITY)
Admission: RE | Admit: 2024-02-19 | Discharge: 2024-02-19 | Disposition: A | Discharge status: Home / Self Care | Payer: Self-pay | Source: Ambulatory Visit | Attending: Physician Assistant | Admitting: Physician Assistant

## 2024-02-19 ENCOUNTER — Encounter (HOSPITAL_COMMUNITY): Payer: Self-pay

## 2024-02-19 DIAGNOSIS — Z113 Encounter for screening for infections with a predominantly sexual mode of transmission: Secondary | ICD-10-CM

## 2024-02-19 MED ORDER — CEFTRIAXONE SODIUM 500 MG IJ SOLR
500.0000 mg | INTRAMUSCULAR | Status: DC
  Administered 2024-02-19: 500 mg via INTRAMUSCULAR

## 2024-02-19 MED ORDER — LIDOCAINE HCL (PF) 1 % IJ SOLN
INTRAMUSCULAR | Status: AC
  Filled 2024-02-19: qty 2

## 2024-02-19 MED ORDER — CEFTRIAXONE SODIUM 500 MG IJ SOLR
INTRAMUSCULAR | Status: AC
  Filled 2024-02-19: qty 500

## 2024-02-19 NOTE — ED Triage Notes (Signed)
 Patient here today for treatment of Std.

## 2024-02-24 ENCOUNTER — Ambulatory Visit (HOSPITAL_COMMUNITY)
Admission: EM | Admit: 2024-02-24 | Discharge: 2024-02-24 | Disposition: A | Discharge status: Home / Self Care | Payer: Self-pay | Attending: Family Medicine | Admitting: Family Medicine

## 2024-02-24 ENCOUNTER — Encounter (HOSPITAL_COMMUNITY): Payer: Self-pay | Admitting: *Deleted

## 2024-02-24 ENCOUNTER — Ambulatory Visit (INDEPENDENT_AMBULATORY_CARE_PROVIDER_SITE_OTHER): Payer: Self-pay

## 2024-02-24 ENCOUNTER — Other Ambulatory Visit: Payer: Self-pay

## 2024-02-24 DIAGNOSIS — M79641 Pain in right hand: Secondary | ICD-10-CM | POA: Insufficient documentation

## 2024-02-24 LAB — URIC ACID: Uric Acid, Serum: 4.3 mg/dL (ref 3.7–8.6)

## 2024-02-24 LAB — CBC
HCT: 48.3 % (ref 39.0–52.0)
Hemoglobin: 16.2 g/dL (ref 13.0–17.0)
MCH: 28.5 pg (ref 26.0–34.0)
MCHC: 33.5 g/dL (ref 30.0–36.0)
MCV: 84.9 fL (ref 80.0–100.0)
Platelets: 258 10*3/uL (ref 150–400)
RBC: 5.69 MIL/uL (ref 4.22–5.81)
RDW: 12.6 % (ref 11.5–15.5)
WBC: 7.4 10*3/uL (ref 4.0–10.5)
nRBC: 0 % (ref 0.0–0.2)

## 2024-02-24 LAB — CK: Total CK: 121 U/L (ref 49–397)

## 2024-02-24 LAB — SEDIMENTATION RATE: Sed Rate: 2 mm/h (ref 0–16)

## 2024-02-24 MED ORDER — PREDNISONE 20 MG PO TABS: 40.0000 mg | ORAL_TABLET | Freq: Every day | ORAL | 0 refills | Status: AC

## 2024-02-24 MED ORDER — DEXAMETHASONE SODIUM PHOSPHATE 10 MG/ML IJ SOLN
INTRAMUSCULAR | Status: AC
  Filled 2024-02-24: qty 1

## 2024-02-24 MED ORDER — DEXAMETHASONE SODIUM PHOSPHATE 10 MG/ML IJ SOLN
10.0000 mg | Freq: Once | INTRAMUSCULAR | Status: AC
  Administered 2024-02-24: 10 mg via INTRAMUSCULAR

## 2024-02-24 NOTE — ED Triage Notes (Signed)
 PT DOB and Full name confirmed

## 2024-02-24 NOTE — ED Provider Notes (Signed)
 [facility]    CSN: [id number] Arrival date & time: 02/24/24  1701      History   Chief Complaint Chief Complaint  Patient presents with   hand swelling    HPI Johnny Payne is a 28 y.o. male.   Patient presents to clinic over concerns of warmth, swelling and pain at the base of his index finger.  He noticed the area was tingling last night.  This morning when he got up for work the area was throbbing.  He works in a [employer] and was not able to use his hands or do much work today due to the pain and swelling.  He has taken ibuprofen .  Denies any injuries or trauma.  Denies any breaks to the skin, bug bites or concern for infection.  Does have a history of HLA-B27 positive arthropathy, chronic pain in the right knee, chronic pain of the left knee, elevated CRP, elevated sed rate, and personal history of gout.  Follows with rheumatology.  The history is provided by medical records and the patient.    Past Medical History:  Diagnosis Date   Eczema     Patient Active Problem List   Diagnosis Date Noted   Constipation 08/24/2011    History reviewed. No pertinent surgical history.     Home Medications    Prior to Admission medications   Medication Sig Start Date End Date Taking? Authorizing Provider  predniSONE  (DELTASONE ) 20 MG tablet Take 2 tablets (40 mg total) by mouth daily for 5 days. 02/24/24 02/29/24 Yes Harlow Lighter, Tacey Dimaggio  N, FNP    Family History Family History  Problem Relation Age of Onset   Healthy Mother    Other Father        blood clot   Healthy Sister    Healthy Sister    Healthy Sister    Healthy Sister    Healthy Sister    Healthy Brother    Healthy Brother    Asthma Maternal Uncle    Cancer Maternal Grandfather     Social History Social History   Tobacco Use   Smoking status: Never    Passive exposure: Current   Smokeless tobacco: Never  Vaping Use   Vaping status: Never Used  Substance Use Topics   Alcohol use: No    Drug use: No     Allergies   Vancomycin   Review of Systems Review of Systems  Per HPI  Physical Exam Triage Vital Signs ED Triage Vitals  Encounter Vitals Group     BP 02/24/24 1815 114/74     Systolic BP Percentile --      Diastolic BP Percentile --      Pulse Rate 02/24/24 1815 65     Resp 02/24/24 1815 20     Temp 02/24/24 1815 98.4 F (36.9 C)     Temp src --      SpO2 02/24/24 1815 97 %     Weight --      Height --      Head Circumference --      Peak Flow --      Pain Score 02/24/24 1813 10     Pain Loc --      Pain Education --      Exclude from Growth Chart --    No data found.  Updated Vital Signs BP 114/74   Pulse 65   Temp 98.4 F (36.9 C)   Resp 20   SpO2 97%  Visual Acuity Right Eye Distance:   Left Eye Distance:   Bilateral Distance:    Right Eye Near:   Left Eye Near:    Bilateral Near:     Physical Exam Vitals and nursing note reviewed.  Constitutional:      Appearance: Normal appearance.  HENT:     Head: Normocephalic and atraumatic.     Right Ear: External ear normal.     Left Ear: External ear normal.     Nose: Nose normal.     Mouth/Throat:     Mouth: Mucous membranes are moist.  Cardiovascular:     Rate and Rhythm: Normal rate.  Pulmonary:     Effort: Pulmonary effort is normal. No respiratory distress.  Musculoskeletal:        General: Swelling and tenderness present. No deformity or signs of injury. Normal range of motion.     Right hand: Swelling present. Normal capillary refill. Normal pulse.     Comments: Swelling, warmth and erythema to the base of the index finger.  Atraumatic.  Skin:    General: Skin is warm and dry.     Capillary Refill: Capillary refill takes less than 2 seconds.     Findings: Erythema present.  Neurological:     General: No focal deficit present.     Mental Status: He is alert.  Psychiatric:        Mood and Affect: Mood normal.        Behavior: Behavior is cooperative.      UC  Treatments / Results  Labs (all labs ordered are listed, but only abnormal results are displayed) Labs Reviewed  CBC  URIC ACID  CK  SEDIMENTATION RATE    EKG   Radiology No results found.  Procedures Procedures (including critical care time)  Medications Ordered in UC Medications  dexamethasone  (DECADRON ) injection 10 mg (10 mg Intramuscular Given 02/24/24 1853)    Initial Impression / Assessment and Plan / UC Course  I have reviewed the triage vital signs and the nursing notes.  Pertinent labs & imaging results that were available during my care of the patient were reviewed by me and considered in my medical decision making (see chart for details).  Vitals in triage reviewed, patient is hemodynamically stable.  Erythema, warmth and swelling with tenderness to the base of the right index finger.  Imaging by my interpretation does not show any acute bony abnormalities, atraumatic.  History of HLA-B27 positive arthropathy and gout.  Suspicious for inflammatory process, steroids will help in the setting of gout or acute inflammation.  Labs drawn to check for inflammation or gout, will contact patient if urgent follow-up is needed.    Plan of care, follow-up care and return precautions given, no questions at this time.  Work note provided.     Final Clinical Impressions(s) / UC Diagnoses   Final diagnoses:  Right hand pain     Discharge Instructions      Your x-ray did not show any obvious bony abnormalities.  You most likely have some kind of inflammatory process, either arthritis or gout.  We have given you a steroid injection that should help with the pain and swelling.  Starting tomorrow take the oral steroids with breakfast.  We have drawn some labs and we will contact you if urgent follow-up is needed.  If this persist please follow-up with your rheumatologist.     ED Prescriptions     Medication Sig Dispense Auth. Provider   predniSONE  (DELTASONE )  20 MG tablet  Take 2 tablets (40 mg total) by mouth daily for 5 days. 10 tablet Harlow Lighter, Esterlene Atiyeh  N, FNP      PDMP not reviewed this encounter.   Alda Hummer, FNP 02/24/24 [phone]

## 2024-02-24 NOTE — ED Triage Notes (Signed)
 Pt presents with swelling to RT hand and pain to RT hand. Pt denies any injury.

## 2024-02-24 NOTE — Discharge Instructions (Signed)
 Your x-ray did not show any obvious bony abnormalities.  You most likely have some kind of inflammatory process, either arthritis or gout.  We have given you a steroid injection that should help with the pain and swelling.  Starting tomorrow take the oral steroids with breakfast.  We have drawn some labs and we will contact you if urgent follow-up is needed.  If this persist please follow-up with your rheumatologist.

## 2024-03-02 ENCOUNTER — Emergency Department (HOSPITAL_COMMUNITY)
Admission: EM | Admit: 2024-03-02 | Discharge: 2024-03-02 | Disposition: A | Discharge status: Home / Self Care | Payer: Self-pay | Attending: Emergency Medicine | Admitting: Emergency Medicine

## 2024-03-02 ENCOUNTER — Encounter (HOSPITAL_COMMUNITY): Payer: Self-pay

## 2024-03-02 ENCOUNTER — Other Ambulatory Visit: Payer: Self-pay

## 2024-03-02 DIAGNOSIS — M79641 Pain in right hand: Secondary | ICD-10-CM | POA: Insufficient documentation

## 2024-03-02 MED ORDER — HYDROCODONE-ACETAMINOPHEN 5-325 MG PO TABS
1.0000 | ORAL_TABLET | Freq: Once | ORAL | Status: AC
  Administered 2024-03-02: 1 via ORAL
  Filled 2024-03-02: qty 1

## 2024-03-02 MED ORDER — DEXAMETHASONE SODIUM PHOSPHATE 10 MG/ML IJ SOLN
8.0000 mg | Freq: Once | INTRAMUSCULAR | Status: AC
  Administered 2024-03-02: 8 mg via INTRAMUSCULAR
  Filled 2024-03-02: qty 1

## 2024-03-02 MED ORDER — METHYLPREDNISOLONE 4 MG PO TBPK: ORAL_TABLET | ORAL | 0 refills | Status: AC

## 2024-03-02 MED ORDER — IBUPROFEN 400 MG PO TABS
400.0000 mg | ORAL_TABLET | Freq: Once | ORAL | Status: AC | PRN
  Administered 2024-03-02: 400 mg via ORAL
  Filled 2024-03-02: qty 1

## 2024-03-02 MED ORDER — HYDROCODONE-ACETAMINOPHEN 5-325 MG PO TABS: 1.0000 | ORAL_TABLET | Freq: Four times a day (QID) | ORAL | 0 refills | Status: AC | PRN

## 2024-03-02 NOTE — Discharge Instructions (Addendum)
 Return for any problem.  ?

## 2024-03-02 NOTE — ED Provider Notes (Signed)
 [facility] AT [facility] Provider Note   CSN: [id number] Arrival date & time: 03/02/24  1914     History  Chief Complaint  Patient presents with   Joint Swelling    Johnny Payne is a 28 y.o. male.  28 year old male with prior medical history as detailed below presents for evaluation.  Patient complains of pain to his right hand.  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  Patient denies trauma to the hand.  Patient's pain is centered over the second MCP joint.  The history is provided by the patient.       Home Medications Prior to Admission medications   Not on File      Allergies    Vancomycin    Review of Systems   Review of Systems  All other systems reviewed and are negative.   Physical Exam Updated Vital Signs BP (!) 142/92   Pulse 71   Temp 98 F (36.7 C) (Oral)   Resp 18   Ht 5\' 11"  (1.803 m)   Wt 65.8 kg   SpO2 100%   BMI 20.22 kg/m  Physical Exam Vitals and nursing note reviewed.  Constitutional:      General: He is not in acute distress.    Appearance: Normal appearance. He is well-developed.  HENT:     Head: Normocephalic and atraumatic.  Eyes:     Conjunctiva/sclera: Conjunctivae normal.     Pupils: Pupils are equal, round, and reactive to light.  Cardiovascular:     Rate and Rhythm: Normal rate and regular rhythm.     Heart sounds: Normal heart sounds.  Pulmonary:     Effort: Pulmonary effort is normal. No respiratory distress.     Breath sounds: Normal breath sounds.  Abdominal:     General: There is no distension.     Palpations: Abdomen is soft.     Tenderness: There is no abdominal tenderness.  Musculoskeletal:        General: No deformity. Normal range of motion.     Cervical back: Normal range of motion and neck supple.     Comments: Mild tenderness over the second MCP joint on the right hand.  Exam is consistent with likely gout versus reactive arthritis.  Full active range of  motion of the right hand is preserved.  Skin:    General: Skin is warm and dry.  Neurological:     General: No focal deficit present.     Mental Status: He is alert and oriented to person, place, and time.     ED Results / Procedures / Treatments   Labs (all labs ordered are listed, but only abnormal results are displayed) Labs Reviewed - No data to display  EKG None  Radiology No results found.  Procedures Procedures    Medications Ordered in ED Medications  ibuprofen  (ADVIL ) tablet 400 mg (400 mg Oral Given 03/02/24 1148)    ED Course/ Medical Decision Making/ A&P                                 Medical Decision Making Risk Prescription drug management.    Medical Screen Complete  This patient presented to the ED with complaint of right hand pain.  This complaint involves an extensive number of treatment options. The initial differential diagnosis includes, but is not limited to, gout versus reactive arthritis  This presentation is: Acute, Chronic,  Self-Limited, Previously Undiagnosed, Uncertain Prognosis, and Complicated  Patient with known history of HLA-B 27, gout presents with pain in the right second MCP joint.  Patient denies trauma.  With treatment patient feels improved.  Patient will benefit from Medrol  Dosepak.  Patient has established outpatient care with both rheumatology and orthopedics.  The importance of close follow-up stressed.  Strict return precautions given and understood.  Additional history obtained:  External records from outside sources obtained and reviewed including prior ED visits and prior Inpatient records.    Problem List / ED Course:  Right hand pain   Disposition:  After consideration of the diagnostic results and the patients response to treatment, I feel that the patent would benefit from close outpatient follow-up.          Final Clinical Impression(s) / ED Diagnoses Final diagnoses:  Right hand pain     Rx / DC Orders ED Discharge Orders          Ordered    methylPREDNISolone  (MEDROL  DOSEPAK) 4 MG TBPK tablet        03/02/24 1321    HYDROcodone-acetaminophen  (NORCO/VICODIN) 5-325 MG tablet  Every 6 hours PRN        03/02/24 1321              Burnette Carte, MD 03/02/24 1321

## 2024-03-02 NOTE — ED Notes (Signed)
 Pt provided with ace wrap per request.

## 2024-03-02 NOTE — ED Notes (Signed)
 Pt complaining of worsening pain. Requesting ibuprofen  and an ace wrap for his hand.

## 2024-03-02 NOTE — ED Triage Notes (Addendum)
 Pt here for right hand swelling, denies trauma. Hx of gout. Also C/O upper back pain that started yesterday

## 2024-03-11 NOTE — Progress Notes (Unsigned)
 Office Visit Note  Patient: Johnny Payne             Date of Birth: [birth year]           MRN: [id number]             PCP: Patient, No Pcp Per Referring: No ref. provider found Visit Date: 03/16/2024 Occupation: @GUAROCC @  Subjective:  Pain in multiple joints   History of Present Illness: Johnny Payne is a 28 y.o. male with history of  HLA-B27 positive arthropathy.  Patient was last seen in the office on 11/17/2023 at which time he was taking sulfasalazine  500 mg 2 tablets twice daily.  He was tolerating sulfasalazine  without any side effects but ran out of the prescription 2 to 3 months ago and did not request a refill from our office.  Patient states that he had clinically been doing well while taking sulfasalazine  and up until about 3 weeks ago he has been flare free.  Patient states that starting 3 weeks ago he started to have severe pain and swelling in the right hand.  He was evaluated at urgent care on 02/24/2024 at which time he was given a steroid injection as well as a prednisone  taper starting at 40 mg x5 days.  The inflammation in his right hand improved temporarily but returned even more severe 3 days later.  He was reevaluated at urgent care on 03/02/2024 and was given a Decadron  injection, Medrol  Dosepak, and a prescription for Norco.  The pain and inflammation spread to his right wrist and right elbow.  He is also having discomfort in the neck, right foot, and left foot.  He has having swelling in the left 3rd, 4th, and 5th toes which started 1 week ago.  Activities of Daily Living:  Patient reports joint stiffness lasting all day  Patient Denies nocturnal pain.  Difficulty dressing/grooming: Denies Difficulty climbing stairs: Denies Difficulty getting out of chair: Denies Difficulty using hands for taps, buttons, cutlery, and/or writing: Reports  Review of Systems  Constitutional:  Negative for fatigue.  HENT:  Negative for mouth sores and mouth dryness.   Eyes:  Negative  for dryness.  Cardiovascular:  Positive for palpitations. Negative for chest pain.  Gastrointestinal:  Negative for blood in stool, constipation and diarrhea.  Endocrine: Negative for increased urination.  Genitourinary:  Negative for involuntary urination.  Musculoskeletal:  Positive for joint pain, joint pain, joint swelling and muscle weakness. Negative for gait problem, myalgias, morning stiffness, muscle tenderness and myalgias.  Skin:  Negative for color change, rash, hair loss and sensitivity to sunlight.  Allergic/Immunologic: Negative for susceptible to infections.  Neurological:  Positive for headaches. Negative for dizziness.  Hematological:  Negative for swollen glands.  Psychiatric/Behavioral:  Positive for depressed mood. Negative for sleep disturbance. The patient is not nervous/anxious.     PMFS History:  Patient Active Problem List   Diagnosis Date Noted   Constipation 08/24/2011    Past Medical History:  Diagnosis Date   Eczema     Family History  Problem Relation Age of Onset   Healthy Mother    Other Father        blood clot   Healthy Sister    Healthy Sister    Healthy Sister    Healthy Sister    Healthy Sister    Healthy Brother    Healthy Brother    Asthma Maternal Uncle    Cancer Maternal Grandfather    History reviewed. No pertinent surgical history.  Social History   Social History Narrative   Not on file   Immunization History  Administered Date(s) Administered   Influenza Split 08/25/2011   Moderna Sars-Covid-2 Vaccination 03/15/2020, 04/12/2020     Objective: Vital Signs: BP 132/79 (BP Location: Left Arm, Patient Position: Sitting, Cuff Size: Normal)   Pulse 92   Resp 16   Ht 5\' 11"  (1.803 m)   Wt 161 lb 3.2 oz (73.1 kg)   BMI 22.48 kg/m    Physical Exam Vitals and nursing note reviewed.  Constitutional:      Appearance: He is well-developed.  HENT:     Head: Normocephalic and atraumatic.  Eyes:     Conjunctiva/sclera:  Conjunctivae normal.     Pupils: Pupils are equal, round, and reactive to light.  Cardiovascular:     Rate and Rhythm: Normal rate and regular rhythm.     Heart sounds: Normal heart sounds.  Pulmonary:     Effort: Pulmonary effort is normal.     Breath sounds: Normal breath sounds.  Abdominal:     General: Bowel sounds are normal.     Palpations: Abdomen is soft.  Musculoskeletal:     Cervical back: Normal range of motion and neck supple.  Skin:    General: Skin is warm and dry.     Capillary Refill: Capillary refill takes less than 2 seconds.  Neurological:     Mental Status: He is alert and oriented to person, place, and time.  Psychiatric:        Behavior: Behavior normal.           Musculoskeletal Exam: C-spine has discomfort with lateral rotation.  No midline spinal tenderness.  No SI joint tenderness upon palpation today.  Shoulder joints have good range of motion.  Limited extension of the right elbow with tenderness and inflammation along the right elbow joint line.  Limited extension and flexion of the right wrist with tenderness and swelling.  Tenderness and synovitis in the right second MCP and PIP joint.  Incomplete right fist formation.  Knee joints have good range of motion with no effusion.  Ankle joints have good range of motion with no tenderness or joint swelling.  Tenderness and synovitis of the left 3rd, 4th, and 5th MTP joints.  Inflammation in the left fourth PIP joint.  CDAI Exam: CDAI Score: -- Patient Global: --; Provider Global: -- Swollen: 8 ; Tender: 8  Joint Exam 03/16/2024      Right  Left  Elbow  Swollen Tender     Wrist  Swollen Tender     MCP 2  Swollen Tender     PIP 2 (finger)  Swollen Tender     MTP 3     Swollen Tender  MTP 4     Swollen Tender  MTP 5     Swollen Tender  PIP 4 (toe)     Swollen Tender     Investigation: No additional findings.  Imaging: DG Hand Complete Right Result Date: 02/24/2024 CLINICAL DATA:  Pain at the  base of the index finger. EXAM: RIGHT HAND - COMPLETE 3+ VIEW COMPARISON:  None Available. FINDINGS: There is no evidence of fracture or dislocation. There is no evidence of arthropathy or other focal bone abnormality. Soft tissues are unremarkable. IMPRESSION: Negative. Electronically Signed   By: Tyron Gallon M.D.   On: 02/24/2024 18:52    Recent Labs: Lab Results  Component Value Date   WBC 7.4 02/24/2024   HGB 16.2 02/24/2024  PLT 258 02/24/2024   NA 138 11/17/2023   K 4.0 11/17/2023   CL 104 11/17/2023   CO2 27 11/17/2023   GLUCOSE 77 11/17/2023   BUN 8 11/17/2023   CREATININE 0.90 11/17/2023   BILITOT 0.8 11/17/2023   ALKPHOS 74 11/17/2023   AST 12 (L) 11/17/2023   ALT 12 11/17/2023   PROT 7.3 11/17/2023   ALBUMIN 4.3 11/17/2023   CALCIUM 9.4 11/17/2023   GFRAA NOT CALCULATED 08/23/2011   QFTBGOLDPLUS Negative 09/29/2023    Speciality Comments: No specialty comments available.  Procedures:  No procedures performed Allergies: Vancomycin     Assessment / Plan:     Visit Diagnoses: HLA-B27 positive arthropathy - HLA-B27+,elevated ESR &CRP, recurrent knee effusions,high suspicion for reactive arthritis following gastroenteritis-end of September 2024, RF-, Anti-CCP: Patient was last seen in the office on 11/17/2023 at which time he was taking sulfasalazine  500 mg 2 tablets twice daily x1 month.  He was previously tolerating sulfasalazine  without any side effects and had noticed clinical improvement.  He has had less frequent and less severe flares and was able to reduce his intake of Advil .  The patient ran out of the prescription for sulfasalazine  2 to 3 months ago at which time he was clinically doing well and did not request a refill.  He presents today experiencing a flare involving multiple joints.  The flare initially started in his right hand 3 weeks ago but has progressed to involve his right wrist, right elbow, and left foot.  He was evaluated at urgent care on 02/24/2024  at which time he had a IM decadron  injection performed and was prescribed prednisone  40 mg x5 days.  He received temporary relief for about 3 days but his symptoms recurred and were even more severe involving multiple joints.  He was reevaluated in the ED on 03/02/2024-patient was prescribed a Medrol  Dosepak and Norco.  Patient presents today with ongoing pain and inflammation involving multiple joints.  He was unable to fully extend the right elbow and has tenderness and swelling along the joint line.  Limited flexion and extension of the right wrist with tenderness and swelling noted.  Tenderness and synovitis involving the right second MCP and PIP joint with incomplete right fist formation noted.  He also has tenderness and synovitis in the left 3rd, 4th, and 5th MTP joints. The patient would like to reinitiate sulfasalazine  500 mg 2 tablets twice daily.  Discussed my concern for the severity of joint involvement currently and discussed the importance of remaining compliant taking sulfasalazine  as prescribed.  The patient now has Medicaid [place]-family planning to be discussed the option of trying to get Humira approved as combination therapy.  Reviewed indications, contraindications, potential side effects of Humira today in detail.  All questions were addressed and consent was obtained.  Plan to apply for Humira 40 mg of days injections every 14 days.  If approved to return to the office for administration of the first injection. To alleviate his current flare a IM 80 mg depo-medrol  injection was performed today.  A prednisone  taper starting at 20 mg tapering by 5 mg every week was also sent to the pharmacy which she can initiate tomorrow.  He will follow up in 6 weeks or sooner if needed.   Plan: methylPREDNISolone  acetate (DEPO-MEDROL ) injection 80 mg  Counseled patient that Humira is a TNF blocking agent.  Counseled patient on purpose, proper use, and adverse effects of Humira.  Reviewed the most common  adverse effects including infections,  headache, and injection site reactions. Discussed that there is the possibility of an increased risk of malignancy including non-melanoma skin cancer but it is not well understood if this increased risk is due to the medication or the disease state.  Advised patient to get yearly dermatology exams due to risk of skin cancer. Counseled patient that Humira should be held prior to scheduled surgery.  Counseled patient to avoid live vaccines while on Humira.  Recommend annual influenza, PCV 15 or PCV20 or Pneumovax 23, and Shingrix as indicated.  Reviewed the importance of regular labs while on Humira therapy.  Will monitor CBC and CMP 1 month after starting and then every 3 months routinely thereafter. Will monitor TB gold annually. Standing orders placed.    Provided patient with medication education material and answered all questions.  Patient consented to Humira.  Will upload consent into the media tab.  Reviewed storage instructions of Humira.  Advised initial injection must be administered in office.  Patient verbalized understanding.   Dose will be 40 mg sq injections every 14 days.  Prescription pending lab results and/or insurance approval.  High risk medication use - Plan to reinitiate sulfasalazine  500 mg 2 tablets by mouth twice daily.   Plan to apply for humira 40 mg sq injections every 14 days--if approved by insurance he will return to the office for administration of the first injection.  CBC and CMP updated on 11/17/23.  CBC updated on 02/24/24. CMP updated today.  He will require updated lab work in 1 month then every 3 months.  Standing orders for CBC and CMP remain in place. Hep B and C negative 09/29/23.  Immunoglobulins WNL 09/29/23 SPEP normal 09/29/23 TB gold negative on 09/29/23--plan to repeat yearly.  - Plan: Comprehensive metabolic panel with GFR  Localized swelling on right hand -Patient presents today with significant swelling and pain  involving the right hand.  He has been experiencing a flare involving the right hand for the past 3 weeks with no identifiable trigger.  He has been out of his prescription for sulfasalazine  for the past 2 to 3 months.  He has been using an Ace wrap for compression and has recently taken a prescription of prednisone  as well as a Medrol  Dosepak.  He has also had an IM Decadron  injection which bided temporary relief.  He was unable to make a complete fist today and has ongoing tenderness and synovitis in the right second MCP and PIP joint with tenderness and inflammation in the right wrist.  X-rays of the right hand were obtained on 02/24/2024 which was negative for a fracture or dislocation.  No evidence of arthropathy or focal bony abnormality noted.   A IM 80 mg Depo-Medrol  injection was performed today to try to alleviate his current flare.  A prednisone  taper was also sent to the pharmacy.  He will be reinitiating sulfasalazine  and will be applying for Humira as combination therapy..  Plan: methylPREDNISolone  acetate (DEPO-MEDROL ) injection 80 mg  Elbow swelling, right - Limited extension of the right elbow.  Tenderness and swelling along the right elbow joint line noted.  IM Depo-Medrol  injection was performed today prednisone  taper was sent to the pharmacy today.  Plan: methylPREDNISolone  acetate (DEPO-MEDROL ) injection 80 mg  Swelling of left foot - Tenderness and synovitis of the left 3rd, 4th, and 5th MTP joints and left 4th PIP joint. IM depo-medrol  injection was performed and a prednisone  taper was sent to the pharmacy.    Plan: methylPREDNISolone  acetate (DEPO-MEDROL ) injection  80 mg  Chronic pain of right knee - No effusion noted today.  Plan: methylPREDNISolone  acetate (DEPO-MEDROL ) injection 80 mg  Effusion of right knee - X-rays of the right knee were updated on 07/26/2023 which did not reveal any evidence of arthropathy or bone abnormality.  No erosive changes.  No effusion noted.  - Plan:  methylPREDNISolone  acetate (DEPO-MEDROL ) injection 80 mg  Chronic pain of left knee - X-rays of the left knee on 08/04/2023 --joint spaces maintained, large suprapatellar joint effusion.  Aspiration 08/04/2023.cortisone injection on 08/21/2023. Synovial thickening but no effusion noted today.    Elevated C-reactive protein (CRP) - CRP was 10.5 on 07/26/2023. - Plan: methylPREDNISolone  acetate (DEPO-MEDROL ) injection 80 mg  Elevated sed rate - ESR was 37 on 07/26/2023. ESR 2 on 02/24/24.  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  Uric acid was 4.3 on 02/24/24.   - Plan: methylPREDNISolone  acetate (DEPO-MEDROL ) injection 80 mg   Orders: Orders Placed This Encounter  Procedures   Comprehensive metabolic panel with GFR   Meds ordered this encounter  Medications   predniSONE  (DELTASONE ) 5 MG tablet    Sig: Take 4 tabs po x 7 days, 3  tabs po x 7 days, 2  tabs po x 7 days, 1  tab po x 7 days    Dispense:  70 tablet    Refill:  0   sulfaSALAzine  (AZULFIDINE ) 500 MG tablet    Sig: Take 1 tablet (500 mg total) by mouth 4 (four) times daily.    Dispense:  360 tablet    Refill:  0   methylPREDNISolone  acetate (DEPO-MEDROL ) injection 80 mg   Follow-Up Instructions: Return in about 6 weeks (around 04/27/2024) for HLA-B27+ arthropathy.   Romayne Clubs, PA-C  Note - This record has been created using Dragon software.  Chart creation errors have been sought, but may not always  have been located. Such creation errors do not reflect on  the standard of medical care.

## 2024-03-16 ENCOUNTER — Ambulatory Visit: Payer: Self-pay | Attending: Physician Assistant | Admitting: Physician Assistant

## 2024-03-16 ENCOUNTER — Encounter: Payer: Self-pay | Admitting: Physician Assistant

## 2024-03-16 VITALS — BP 132/79 | HR 92 | Resp 16 | Ht 71.0 in | Wt 161.2 lb

## 2024-03-16 DIAGNOSIS — R7982 Elevated C-reactive protein (CRP): Secondary | ICD-10-CM | POA: Insufficient documentation

## 2024-03-16 DIAGNOSIS — M25421 Effusion, right elbow: Secondary | ICD-10-CM | POA: Insufficient documentation

## 2024-03-16 DIAGNOSIS — M1280 Other specific arthropathies, not elsewhere classified, unspecified site: Secondary | ICD-10-CM | POA: Insufficient documentation

## 2024-03-16 DIAGNOSIS — M7989 Other specified soft tissue disorders: Secondary | ICD-10-CM | POA: Insufficient documentation

## 2024-03-16 DIAGNOSIS — Z79899 Other long term (current) drug therapy: Secondary | ICD-10-CM | POA: Insufficient documentation

## 2024-03-16 DIAGNOSIS — G8929 Other chronic pain: Secondary | ICD-10-CM | POA: Insufficient documentation

## 2024-03-16 DIAGNOSIS — M25561 Pain in right knee: Secondary | ICD-10-CM | POA: Insufficient documentation

## 2024-03-16 DIAGNOSIS — M25461 Effusion, right knee: Secondary | ICD-10-CM | POA: Insufficient documentation

## 2024-03-16 DIAGNOSIS — R2231 Localized swelling, mass and lump, right upper limb: Secondary | ICD-10-CM | POA: Insufficient documentation

## 2024-03-16 DIAGNOSIS — Z8739 Personal history of other diseases of the musculoskeletal system and connective tissue: Secondary | ICD-10-CM | POA: Insufficient documentation

## 2024-03-16 DIAGNOSIS — M25562 Pain in left knee: Secondary | ICD-10-CM | POA: Insufficient documentation

## 2024-03-16 DIAGNOSIS — R700 Elevated erythrocyte sedimentation rate: Secondary | ICD-10-CM | POA: Insufficient documentation

## 2024-03-16 MED ORDER — SULFASALAZINE 500 MG PO TABS: 500.0000 mg | ORAL_TABLET | Freq: Four times a day (QID) | ORAL | 0 refills | Status: AC

## 2024-03-16 MED ORDER — METHYLPREDNISOLONE ACETATE 40 MG/ML IJ SUSP
80.0000 mg | Freq: Once | INTRAMUSCULAR | Status: AC
  Administered 2024-03-16: 80 mg via INTRAMUSCULAR

## 2024-03-16 MED ORDER — PREDNISONE 5 MG PO TABS
ORAL_TABLET | ORAL | 0 refills | Status: AC
Start: 2024-03-16 — End: ?

## 2024-03-16 NOTE — Patient Instructions (Addendum)
 Standing Labs We placed an order today for your standing lab work.   Please have your standing labs drawn in 1 month after starting Humira and then every 3 months.   Please have your labs drawn 2 weeks prior to your appointment so that the provider can discuss your lab results at your appointment, if possible.  Please note that you may see your imaging and lab results in MyChart before we have reviewed them. We will contact you once all results are reviewed. Please allow our office up to 72 hours to thoroughly review all of the results before contacting the office for clarification of your results.  WALK-IN LAB HOURS  Monday through Thursday from 8:00 am -12:30 pm and 1:00 pm-4:00 pm and Friday from 8:00 am-12:00 pm.  Patients with office visits requiring labs will be seen before walk-in labs.  You may encounter longer than normal wait times. Please allow additional time. Wait times may be shorter on  Monday and Thursday afternoons.  We do not book appointments for walk-in labs. We appreciate your patience and understanding with our staff.   Labs are drawn by [facility]. Please bring your co-pay at the time of your lab draw.  You may receive a bill from [facility] for your lab work.  Please note if you are on Hydroxychloroquine and and an order has been placed for a Hydroxychloroquine level,  you will need to have it drawn 4 hours or more after your last dose.  If you wish to have your labs drawn at another location, please call the office 24 hours in advance so we can fax the orders.  The office is located at [address], [place], [place] [address]   If you have any questions regarding directions or hours of operation,  please call [phone].   As a reminder, please drink plenty of water prior to coming for your lab work. Thanks!     Adalimumab Injection What is this medication? ADALIMUMAB (ay da LIM yoo mab) treats autoimmune conditions, such as psoriasis, arthritis, Crohn  disease, and ulcerative colitis. It works by slowing down an overactive immune system.  It may also be used to treat hidradenitis suppurativa (HS). HS is a condition that causes painful lumps under the skin in areas such as the armpits and groin. It belongs to a group of medications called TNF inhibitors. It is a monoclonal antibody. This medicine may be used for other purposes; ask your health care provider or pharmacist if you have questions. COMMON BRAND NAME(S): ABRILADA, AMJEVITA, CYLTEZO, HADLIMA, Hulio, Hulio PEN, Humira, HUMIRA PEN, Hyrimoz, Idacio, Simlandi, Yuflyma, YUSIMRY What should I tell my care team before I take this medication? They need to know if you have any of these conditions: Cancer Diabetes (high blood sugar) Having surgery Heart disease Hepatitis B Immune system problems Infections, such as tuberculosis (TB) or other bacterial, fungal, or viral infections Multiple sclerosis Recent or upcoming vaccine An unusual or allergic reaction to adalimumab, mannitol, latex, rubber, other medications, foods, dyes, or preservatives Pregnant or trying to get pregnant Breast-feeding How should I use this medication? This medication is injected under the skin. It may be given by your care team in a hospital or clinic setting. It may also be given at home. If you get this medication at home, you will be taught how to prepare and give it. Use exactly as directed. Take it as directed on the prescription label. Keep taking it unless your care team tells you to stop.  This medication comes with INSTRUCTIONS FOR USE. Ask your pharmacist for directions on how to use this medication. Read the information carefully. Talk to your pharmacist or care team if you have questions. It is important that you put your used needles and syringes in a special sharps container. Do not put them in a trash can. If you do not have a sharps container, call your pharmacist or care team to get one. A special  MedGuide will be given to you by the pharmacist with each prescription and refill. If you are getting this medication in a hospital or clinic, a special MedGuide will be given to you before each treatment. Be sure to read this information carefully each time. Talk to your care team about the use of this medication in children. While it be prescribed for children as young as 2 years for selected conditions, precautions do apply. Overdosage: If you think you have taken too much of this medicine contact a poison control center or emergency room at once. NOTE: This medicine is only for you. Do not share this medicine with others. What if I miss a dose? If you get this medication at the hospital or clinic: it is important not to miss your dose. Call your care team if you are unable to keep an appointment. If you give yourself this medication at home: If you miss a dose, take it as soon as you can. If it is almost time for your next dose, take only that dose. Do not take double or extra doses. Call your care team with questions. What may interact with this medication? Do not take this medication with any of the following: Abatacept Anakinra Biologic medications, such as certolizumab, etanercept, golimumab, infliximab Live virus vaccines This medication may also interact with the following: Cyclosporine Theophylline Vaccines Warfarin This list may not describe all possible interactions. Give your health care provider a list of all the medicines, herbs, non-prescription drugs, or dietary supplements you use. Also tell them if you smoke, drink alcohol, or use illegal drugs. Some items may interact with your medicine. What should I watch for while using this medication? Visit your care team for regular checks on your progress. Tell your care team if your symptoms do not start to get better or if they get worse. You will be tested for tuberculosis (TB) before you start this medication. If your care team  prescribes any medication for TB, you should start taking the TB medication before starting this medication. Make sure to finish the full course of TB medication. This medication may increase your risk of getting an infection. Call your care team for advice if you get a fever, chills, sore throat, or other symptoms of a cold or flu. Do not treat yourself. Try to avoid being around people who are sick. Talk to your care team about your risk of cancer. You may be more at risk for certain types of cancer if you take this medication. What side effects may I notice from receiving this medication? Side effects that you should report to your care team as soon as possible: Allergic reactions--skin rash, itching, hives, swelling of the face, lips, tongue, or throat Aplastic anemia--unusual weakness or fatigue, dizziness, headache, trouble breathing, increased bleeding or bruising Body pain, tingling, or numbness Heart failure--shortness of breath, swelling of the ankles, feet, or hands, sudden weight gain, unusual weakness or fatigue Infection--fever, chills, cough, sore throat, wounds that don't heal, pain or trouble when passing urine, general feeling of discomfort  or being unwell Lupus-like syndrome--joint pain, swelling, or stiffness, butterfly-shaped rash on the face, rashes that get worse in the sun, fever, unusual weakness or fatigue Unusual bruising or bleeding Side effects that usually do not require medical attention (report to your care team if they continue or are bothersome): Headache Nausea Pain, redness, or irritation at injection site Runny or stuffy nose Sore throat Stomach pain This list may not describe all possible side effects. Call your doctor for medical advice about side effects. You may report side effects to FDA at 1-800-FDA-1088. Where should I keep my medication? Keep out of the reach of children and pets. Store in the refrigerator. Do not freeze. Keep this medication in the  original packaging until you are ready to take it. Protect from light. Get rid of any unused medication after the expiration date. This medication may be stored at room temperature for up to 14 days. Keep this medication in the original packaging. Protect from light. If it is stored at room temperature, get rid of any unused medication after 14 days or after it expires, whichever is first. To get rid of medications that are no longer needed or have expired: Take the medication to a medication take-back program. Check with your pharmacy or law enforcement to find a location. If you cannot return the medication, ask your pharmacist or care team how to get rid of this medication safely. NOTE: This sheet is a summary. It may not cover all possible information. If you have questions about this medicine, talk to your doctor, pharmacist, or health care provider.  2024 Elsevier/Gold Standard (2023-09-18 00:00:00)  If you have signs or symptoms of an infection or start antibiotics: First, call your PCP for workup of your infection. Hold your medication through the infection, until you complete your antibiotics, and until symptoms resolve if you take the following: Injectable medication (Actemra, Benlysta, Cimzia, Cosentyx, Enbrel, Humira, Kevzara, Orencia, Remicade, Simponi, Stelara, Taltz, Tremfya) Methotrexate Leflunomide (Arava) Mycophenolate (Cellcept) Cloria Danger, Olumiant, or Rinvoq   Vaccines You are taking a medication(s) that can suppress your immune system.  The following immunizations are recommended: Flu annually Covid-19  Td/Tdap (tetanus, diphtheria, pertussis) every 10 years Pneumonia (Prevnar 15 then Pneumovax 23 at least 1 year apart.  Alternatively, can take Prevnar 20 without needing additional dose) Shingrix: 2 doses from 4 weeks to 6 months apart  Please check with your PCP to make sure you are up to date.

## 2024-03-16 NOTE — Progress Notes (Signed)
 Per Jacinta Martinis PA-C patient given Depo Medrol  80 mg IM. Patient tolerated injection well.   Administrations This Visit     methylPREDNISolone  acetate (DEPO-MEDROL ) injection 80 mg     Admin Date 03/16/2024 Action Given Dose 80 mg Route Intramuscular Documented By Adrianne Horn, LPN

## 2024-03-17 ENCOUNTER — Ambulatory Visit: Payer: Self-pay | Admitting: Physician Assistant

## 2024-03-17 LAB — COMPREHENSIVE METABOLIC PANEL WITH GFR
AG Ratio: 1.5 (calc) (ref 1.0–2.5)
ALT: 8 U/L — ABNORMAL LOW (ref 9–46)
AST: 11 U/L (ref 10–40)
Albumin: 4.3 g/dL (ref 3.6–5.1)
Alkaline phosphatase (APISO): 82 U/L (ref 36–130)
BUN: 9 mg/dL (ref 7–25)
CO2: 27 mmol/L (ref 20–32)
Calcium: 9.2 mg/dL (ref 8.6–10.3)
Chloride: 105 mmol/L (ref 98–110)
Creat: 0.64 mg/dL (ref 0.60–1.24)
Globulin: 2.8 g/dL (ref 1.9–3.7)
Glucose, Bld: 71 mg/dL (ref 65–99)
Potassium: 4.1 mmol/L (ref 3.5–5.3)
Sodium: 140 mmol/L (ref 135–146)
Total Bilirubin: 0.4 mg/dL (ref 0.2–1.2)
Total Protein: 7.1 g/dL (ref 6.1–8.1)
eGFR: 134 mL/min/{1.73_m2} (ref >=60)

## 2024-03-17 NOTE — Progress Notes (Signed)
 Pharmacy team will start Humira benefits investigation

## 2024-03-17 NOTE — Progress Notes (Signed)
 ALT is slightly low. Rest of CMP WNL.   Ok to reinitiate sulfasalazine  and apply for humira.

## 2024-03-18 ENCOUNTER — Telehealth: Payer: Self-pay | Admitting: Pharmacist

## 2024-03-18 NOTE — Telephone Encounter (Signed)
 I have had her sign it and I faxed it over.

## 2024-03-18 NOTE — Telephone Encounter (Signed)
 Submitted Patient Assistance Application to [employer] for HUMIRA along with provider portion, patient portion, medication list. Will update patient when we receive a response.  Phone: [phone] Fax: [phone]  Geraldene Kleine, PharmD, MPH, BCPS, CPP Clinical Pharmacist (Rheumatology and Pulmonology)

## 2024-03-18 NOTE — Telephone Encounter (Addendum)
 Patient is uninsured. Will need to submit patient assistance application for Humira once all forms are completed  Geraldene Kleine, PharmD, MPH, BCPS, CPP Clinical Pharmacist (Rheumatology and Pulmonology)   ----- Message from [facility] Jerrold Morgan V sent at 03/16/2024 10:29 AM EDT ----- Please apply for Humira per Jacinta Martinis. Signed patient assistance application is on Jasmine Maceachern's desk.   Consent obtained and sent to scan center.

## 2024-04-20 NOTE — Telephone Encounter (Signed)
 Talked to [employer] regarding update to patient assistance application process. In need of income documentation. [employer] has reached out via email but will try again via phone call. Called patient and informed him that [employer] will be reaching out. Advised patient to call [employer] if he has not heard from company by Monday.   Deleta Colt PharmD Candidate [phone]  [facility]

## 2024-05-02 NOTE — Telephone Encounter (Signed)
 Patient has not read MyChart message. ATC patient to discuss. Unable to reah. Left detailed VM with next steps as per MyChart

## 2024-05-05 NOTE — Telephone Encounter (Signed)
 Called patient regarding [employer] patient assistance program. Unable to reach. Left a voicemail. Advised patient to fax income documents to [employer] and reach out to them with any questions regarding the patient assistance program. Provided patient with fax and phone number.  Phone: [phone] Fax: [phone]  Deleta Colt PharmD Candidate [phone]  [facility]

## 2024-05-12 NOTE — Telephone Encounter (Signed)
 Letter sent to home due to lack of response from patient. Called [employer] as well and they have confirmed that they had last made outreach on 04/18/2024. They also have not received follow-up communication from patient.  Sherry Pennant, PharmD, MPH, BCPS, CPP Clinical Pharmacist (Rheumatology and Pulmonology)

## 2024-06-02 NOTE — Progress Notes (Deleted)
 Office Visit Note  Patient: Johnny Payne             Date of Birth: 11/05/1995           MRN: [id number]             PCP: Patient, No Pcp Per Referring: No ref. provider found Visit Date: 06/16/2024 Occupation: @GUAROCC @  Subjective:    History of Present Illness: Johnny Payne is a 28 y.o. 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     - HLA-B27+,elevated ESR &CRP, recurrent knee effusions,high suspicion for reactive arthritis following gastroenteritis-end of September 2024, RF-, Anti-CCP: Patient was last seen in the office on 11/17/2023 at which time he was taking sulfasalazine 500 mg 2 tablets twice daily x1 month.  He was previously tolerating sulfasalazine without any side effects and had noticed clinical improvement.  He has had less frequent and less severe flares and was able to reduce his intake of Advil.    Activities of Daily Living:  Patient reports morning stiffness for *** {minute/hour:19697}.   Patient {ACTIONS;DENIES/REPORTS:21021675::Denies} nocturnal pain.  Difficulty dressing/grooming: {ACTIONS;DENIES/REPORTS:21021675::Denies} Difficulty climbing stairs: {ACTIONS;DENIES/REPORTS:21021675::Denies} Difficulty getting out of chair: {ACTIONS;DENIES/REPORTS:21021675::Denies} Difficulty using hands for taps, buttons, cutlery, and/or writing: {ACTIONS;DENIES/REPORTS:21021675::Denies}  No Rheumatology ROS completed.   PMFS History:  Patient Active Problem List   Diagnosis Date Noted   Constipation 08/24/2011    Past Medical History:  Diagnosis Date   Eczema     Family History  Problem Relation Age of Onset   Healthy Mother    Other Father        blood clot   Healthy Sister    Healthy Sister    Healthy Sister    Healthy Sister    Healthy Sister    Healthy Brother    Healthy Brother    Asthma Maternal Uncle    Cancer Maternal Grandfather    No past surgical history on file. Social History   Social History Narrative   Not on file    Immunization History  Administered Date(s) Administered   Influenza Split 08/25/2011   Moderna Sars-Covid-2 Vaccination 03/15/2020, 04/12/2020     Objective: Vital Signs: There were no vitals taken for this visit.   Physical Exam Vitals and nursing note reviewed.  Constitutional:      Appearance: He is well-developed.  HENT:     Head: Normocephalic and atraumatic.  Eyes:     Conjunctiva/sclera: Conjunctivae normal.     Pupils: Pupils are equal, round, and reactive to light.  Cardiovascular:     Rate and Rhythm: Normal rate and regular rhythm.     Heart sounds: Normal heart sounds.  Pulmonary:     Effort: Pulmonary effort is normal.     Breath sounds: Normal breath sounds.  Abdominal:     General: Bowel sounds are normal.     Palpations: Abdomen is soft.  Musculoskeletal:     Cervical back: Normal range of motion and neck supple.  Skin:    General: Skin is warm and dry.     Capillary Refill: Capillary refill takes less than 2 seconds.  Neurological:     Mental Status: He is alert and oriented to person, place, and time.  Psychiatric:        Behavior: Behavior normal.      Musculoskeletal Exam: ***  CDAI Exam: CDAI Score: -- Patient Global: --; Provider Global: -- Swollen: --; Tender: -- Joint Exam 06/16/2024   No joint exam has been documented  for this visit   There is currently no information documented on the homunculus. Go to the Rheumatology activity and complete the homunculus joint exam.  Investigation: No additional findings.  Imaging: No results found.  Recent Labs: Lab Results  Component Value Date   WBC 7.4 02/24/2024   HGB 16.2 02/24/2024   PLT 258 02/24/2024   NA 140 03/16/2024   K 4.1 03/16/2024   CL 105 03/16/2024   CO2 27 03/16/2024   GLUCOSE 71 03/16/2024   BUN 9 03/16/2024   CREATININE 0.64 03/16/2024   BILITOT 0.4 03/16/2024   ALKPHOS 74 11/17/2023   AST 11 03/16/2024   ALT 8 (L) 03/16/2024   PROT 7.1 03/16/2024   ALBUMIN  4.3 11/17/2023   CALCIUM 9.2 03/16/2024   GFRAA NOT CALCULATED 08/23/2011   QFTBGOLDPLUS Negative 09/29/2023    Speciality Comments: No specialty comments available.  Procedures:  No procedures performed Allergies: Vancomycin   Assessment / Plan:     Visit Diagnoses: HLA-B27 positive arthropathy  High risk medication use  Chronic pain of right knee  Effusion of right knee  Chronic pain of left knee  Elevated C-reactive protein (CRP)  Elevated sed rate  Personal history of gout  Localized swelling on right hand  Elbow swelling, right  Orders: No orders of the defined types were placed in this encounter.  No orders of the defined types were placed in this encounter.   Face-to-face time spent with patient was *** minutes. Greater than 50% of time was spent in counseling and coordination of care.  Follow-Up Instructions: No follow-ups on file.   Waddell CHRISTELLA Craze, PA-C  Note - This record has been created using Dragon software.  Chart creation errors have been sought, but may not always  have been located. Such creation errors do not reflect on  the standard of medical care.

## 2024-06-16 ENCOUNTER — Ambulatory Visit: Payer: Self-pay | Admitting: Physician Assistant

## 2024-06-16 DIAGNOSIS — R7982 Elevated C-reactive protein (CRP): Secondary | ICD-10-CM

## 2024-06-16 DIAGNOSIS — R700 Elevated erythrocyte sedimentation rate: Secondary | ICD-10-CM

## 2024-06-16 DIAGNOSIS — R2231 Localized swelling, mass and lump, right upper limb: Secondary | ICD-10-CM

## 2024-06-16 DIAGNOSIS — G8929 Other chronic pain: Secondary | ICD-10-CM

## 2024-06-16 DIAGNOSIS — Z79899 Other long term (current) drug therapy: Secondary | ICD-10-CM

## 2024-06-16 DIAGNOSIS — M25421 Effusion, right elbow: Secondary | ICD-10-CM

## 2024-06-16 DIAGNOSIS — M25461 Effusion, right knee: Secondary | ICD-10-CM

## 2024-06-16 DIAGNOSIS — M1280 Other specific arthropathies, not elsewhere classified, unspecified site: Secondary | ICD-10-CM

## 2024-06-16 DIAGNOSIS — Z8739 Personal history of other diseases of the musculoskeletal system and connective tissue: Secondary | ICD-10-CM

## 2024-09-21 ENCOUNTER — Ambulatory Visit
Admission: EM | Admit: 2024-09-21 | Discharge: 2024-09-21 | Disposition: A | Discharge status: Home / Self Care | Payer: Self-pay

## 2024-09-21 DIAGNOSIS — Z202 Contact with and (suspected) exposure to infections with a predominantly sexual mode of transmission: Secondary | ICD-10-CM | POA: Insufficient documentation

## 2024-09-21 MED ORDER — CEFTRIAXONE SODIUM 500 MG IJ SOLR
500.0000 mg | INTRAMUSCULAR | Status: DC
  Administered 2024-09-21: 500 mg via INTRAMUSCULAR

## 2024-09-21 MED ORDER — DOXYCYCLINE HYCLATE 100 MG PO CAPS: 100.0000 mg | ORAL_CAPSULE | Freq: Two times a day (BID) | ORAL | 0 refills | Status: AC

## 2024-09-21 NOTE — Discharge Instructions (Addendum)
 You were tested for STIs today. You should receive your results in 3-5 days. If you have not received a call from the office or see results in your mychart please call the clinic where you were seen. It is best if your refrain from sexual activity until you get results back. If positive you will need to refrain from sexual activity for 2 weeks and be sure to complete any antibiotics prescribed in their entirety. '

## 2024-09-21 NOTE — ED Provider Notes (Signed)
 [facility]    CSN: [id number] Arrival date & time: 09/21/24  1824      History   Chief Complaint Chief Complaint  Patient presents with   Exposure to STD    HPI Johnny Payne is a 28 y.o. male.   Pt presents today due to exposure to Gonorrhea. Pt denies symptoms such as penile discharge, penile pain, dysuria, testicular pain, rectal discharge, rectal pain, or testicular swelling.  Patient states that he is sexually active with men at the moment.  Patient would like to be treated prophylactically for symptoms, patient declines testing for HIV and syphilis today.  The history is provided by the patient.  Exposure to STD    Past Medical History:  Diagnosis Date   Eczema     Patient Active Problem List   Diagnosis Date Noted   Constipation 08/24/2011    History reviewed. No pertinent surgical history.     Home Medications    Prior to Admission medications   Medication Sig Start Date End Date Taking? Authorizing Provider  doxycycline (VIBRAMYCIN) 100 MG capsule Take 1 capsule (100 mg total) by mouth 2 (two) times daily for 7 days. 09/21/24 09/28/24 Yes Andra Corean BROCKS, PA-C  acetaminophen  (TYLENOL ) 650 MG CR tablet Take 650 mg by mouth every 8 (eight) hours as needed for pain.    [provider]  HYDROcodone -acetaminophen  (NORCO/VICODIN) 5-325 MG tablet Take 1 tablet by mouth every 6 (six) hours as needed. Patient not taking: Reported on 03/16/2024 03/02/24   Laurice Maude BROCKS, MD  ibuprofen  (ADVIL ) 200 MG tablet Take 200 mg by mouth every 6 (six) hours as needed.    [provider]  methylPREDNISolone  (MEDROL  DOSEPAK) 4 MG TBPK tablet Take as per manufacturer's instructions. Patient not taking: Reported on 03/16/2024 03/02/24   Laurice Maude BROCKS, MD  predniSONE  (DELTASONE ) 5 MG tablet Take 4 tabs po x 7 days, 3  tabs po x 7 days, 2  tabs po x 7 days, 1  tab po x 7 days 03/16/24   Cheryl Waddell HERO, PA-C  sulfaSALAzine  (AZULFIDINE ) 500 MG  tablet Take 1 tablet (500 mg total) by mouth 4 (four) times daily. 03/16/24   Cheryl Waddell HERO, PA-C    Family History Family History  Problem Relation Age of Onset   Healthy Mother    Other Father        blood clot   Healthy Sister    Healthy Sister    Healthy Sister    Healthy Sister    Healthy Sister    Healthy Brother    Healthy Brother    Asthma Maternal Uncle    Cancer Maternal Grandfather     Social History Social History   Tobacco Use   Smoking status: Some Days    Types: Cigarettes    Passive exposure: Current   Smokeless tobacco: Never  Vaping Use   Vaping status: Never Used  Substance Use Topics   Alcohol use: No   Drug use: No     Allergies   Vancomycin   Review of Systems Review of Systems   Physical Exam Triage Vital Signs ED Triage Vitals [09/21/24 1841]  Encounter Vitals Group     BP 133/79     Girls Systolic BP Percentile      Girls Diastolic BP Percentile      Boys Systolic BP Percentile      Boys Diastolic BP Percentile      Pulse Rate 83  Resp 16     Temp 98.1 F (36.7 C)     Temp Source Oral     SpO2 97 %     Weight      Height      Head Circumference      Peak Flow      Pain Score 0     Pain Loc      Pain Education      Exclude from Growth Chart    No data found.  Updated Vital Signs BP 133/79 (BP Location: Left Arm)   Pulse 83   Temp 98.1 F (36.7 C) (Oral)   Resp 16   SpO2 97%   Visual Acuity Right Eye Distance:   Left Eye Distance:   Bilateral Distance:    Right Eye Near:   Left Eye Near:    Bilateral Near:     Physical Exam Vitals and nursing note reviewed.  Constitutional:      General: He is not in acute distress.    Appearance: Normal appearance. He is not ill-appearing, toxic-appearing or diaphoretic.  Eyes:     General: No scleral icterus. Cardiovascular:     Rate and Rhythm: Normal rate and regular rhythm.     Heart sounds: Normal heart sounds.  Pulmonary:     Effort: Pulmonary effort is  normal. No respiratory distress.     Breath sounds: Normal breath sounds. No wheezing or rhonchi.  Abdominal:     General: Abdomen is flat. Bowel sounds are normal.     Palpations: Abdomen is soft.     Tenderness: There is no abdominal tenderness. There is no right CVA tenderness or left CVA tenderness.  Skin:    General: Skin is warm.  Neurological:     Mental Status: He is alert and oriented to person, place, and time.  Psychiatric:        Mood and Affect: Mood normal.        Behavior: Behavior normal.      UC Treatments / Results  Labs (all labs ordered are listed, but only abnormal results are displayed) Labs Reviewed  CYTOLOGY, (ORAL, ANAL, URETHRAL) ANCILLARY ONLY  CYTOLOGY, (ORAL, ANAL, URETHRAL) ANCILLARY ONLY    EKG   Radiology No results found.  Procedures Procedures (including critical care time)  Medications Ordered in UC Medications  cefTRIAXone  (ROCEPHIN ) injection 500 mg (has no administration in time range)    Initial Impression / Assessment and Plan / UC Course  I have reviewed the triage vital signs and the nursing notes.  Pertinent labs & imaging results that were available during my care of the patient were reviewed by me and considered in my medical decision making (see chart for details).    Final Clinical Impressions(s) / UC Diagnoses   Final diagnoses:  Contact with and (suspected) exposure to infections with a predominantly sexual mode of transmission     Discharge Instructions      You were tested for STIs today. You should receive your results in 3-5 days. If you have not received a call from the office or see results in your mychart please call the clinic where you were seen. It is best if your refrain from sexual activity until you get results back. If positive you will need to refrain from sexual activity for 2 weeks and be sure to complete any antibiotics prescribed in their entirety.       ED Prescriptions     Medication  Sig Dispense Auth. Provider  doxycycline (VIBRAMYCIN) 100 MG capsule Take 1 capsule (100 mg total) by mouth 2 (two) times daily for 7 days. 14 capsule Andra Corean BROCKS, PA-C      PDMP not reviewed this encounter.   Andra Corean BROCKS, PA-C 09/21/24 1932

## 2024-09-21 NOTE — ED Triage Notes (Signed)
 Pt states he is here for STD testing.  Denies any symptoms.  States he thinks he was exposed to gonorrhea.

## 2024-09-22 LAB — CYTOLOGY, (ORAL, ANAL, URETHRAL) ANCILLARY ONLY
Chlamydia: NEGATIVE
Chlamydia: NEGATIVE
Comment: NEGATIVE
Comment: NEGATIVE
Comment: NEGATIVE
Comment: NORMAL
Comment: NORMAL
Neisseria Gonorrhea: NEGATIVE
Neisseria Gonorrhea: NEGATIVE
Trichomonas: NEGATIVE
